# Patient Record
Sex: Male | Born: 1935 | Race: Black or African American | Hispanic: No | Smoking: Current every day smoker
Health system: Southern US, Community
[De-identification: ages and names within clinical notes are randomized; demographics above are authoritative.]

## PROBLEM LIST (undated history)

## (undated) DIAGNOSIS — C801 Malignant (primary) neoplasm, unspecified: Secondary | ICD-10-CM

## (undated) DIAGNOSIS — D649 Anemia, unspecified: Secondary | ICD-10-CM

## (undated) DIAGNOSIS — I10 Essential (primary) hypertension: Secondary | ICD-10-CM

## (undated) DIAGNOSIS — Z5189 Encounter for other specified aftercare: Secondary | ICD-10-CM

## (undated) DIAGNOSIS — N400 Enlarged prostate without lower urinary tract symptoms: Secondary | ICD-10-CM

## (undated) DIAGNOSIS — F419 Anxiety disorder, unspecified: Secondary | ICD-10-CM

## (undated) HISTORY — DX: Essential (primary) hypertension: I10

## (undated) HISTORY — DX: Benign prostatic hyperplasia without lower urinary tract symptoms: N40.0

## (undated) HISTORY — DX: Encounter for other specified aftercare: Z51.89

## (undated) HISTORY — DX: Malignant (primary) neoplasm, unspecified: C80.1

## (undated) HISTORY — DX: Anemia, unspecified: D64.9

## (undated) HISTORY — PX: OTHER SURGICAL HISTORY: SHX169

## (undated) HISTORY — DX: Anxiety disorder, unspecified: F41.9

---

## 1998-01-17 ENCOUNTER — Other Ambulatory Visit: Admission: RE | Admit: 1998-01-17 | Discharge: 1998-01-17 | Payer: Self-pay | Admitting: Hematology and Oncology

## 1998-02-14 ENCOUNTER — Other Ambulatory Visit: Admission: RE | Admit: 1998-02-14 | Discharge: 1998-02-14 | Payer: Self-pay | Admitting: Hematology and Oncology

## 1998-03-21 ENCOUNTER — Other Ambulatory Visit: Admission: RE | Admit: 1998-03-21 | Discharge: 1998-03-21 | Payer: Self-pay | Admitting: Hematology and Oncology

## 1998-04-22 ENCOUNTER — Ambulatory Visit (HOSPITAL_COMMUNITY): Admission: RE | Admit: 1998-04-22 | Discharge: 1998-04-22 | Payer: Self-pay | Admitting: Hematology and Oncology

## 1998-06-26 ENCOUNTER — Ambulatory Visit (HOSPITAL_COMMUNITY): Admission: RE | Admit: 1998-06-26 | Discharge: 1998-06-26 | Payer: Self-pay | Admitting: General Surgery

## 1998-06-26 ENCOUNTER — Encounter: Payer: Self-pay | Admitting: General Surgery

## 1998-10-05 ENCOUNTER — Emergency Department (HOSPITAL_COMMUNITY): Admission: EM | Admit: 1998-10-05 | Discharge: 1998-10-05 | Payer: Self-pay | Admitting: Emergency Medicine

## 1998-10-29 ENCOUNTER — Encounter: Payer: Self-pay | Admitting: General Surgery

## 1998-10-31 ENCOUNTER — Inpatient Hospital Stay (HOSPITAL_COMMUNITY): Admission: RE | Admit: 1998-10-31 | Discharge: 1998-11-11 | Payer: Self-pay | Admitting: General Surgery

## 1998-11-19 ENCOUNTER — Encounter: Admission: RE | Admit: 1998-11-19 | Discharge: 1998-11-19 | Payer: Self-pay | Admitting: Family Medicine

## 1999-10-28 ENCOUNTER — Ambulatory Visit (HOSPITAL_COMMUNITY): Admission: RE | Admit: 1999-10-28 | Discharge: 1999-10-28 | Payer: Self-pay | Admitting: Gastroenterology

## 2001-11-27 ENCOUNTER — Encounter: Payer: Self-pay | Admitting: Urology

## 2001-11-27 ENCOUNTER — Encounter: Admission: RE | Admit: 2001-11-27 | Discharge: 2001-11-27 | Payer: Self-pay | Admitting: Urology

## 2002-02-02 ENCOUNTER — Encounter: Payer: Self-pay | Admitting: Emergency Medicine

## 2002-02-02 ENCOUNTER — Emergency Department (HOSPITAL_COMMUNITY): Admission: EM | Admit: 2002-02-02 | Discharge: 2002-02-02 | Payer: Self-pay | Admitting: Emergency Medicine

## 2002-12-24 ENCOUNTER — Encounter: Admission: RE | Admit: 2002-12-24 | Discharge: 2002-12-24 | Payer: Self-pay | Admitting: Internal Medicine

## 2002-12-24 ENCOUNTER — Encounter: Payer: Self-pay | Admitting: Internal Medicine

## 2003-02-11 ENCOUNTER — Inpatient Hospital Stay (HOSPITAL_COMMUNITY): Admission: EM | Admit: 2003-02-11 | Discharge: 2003-02-11 | Payer: Self-pay | Admitting: Emergency Medicine

## 2003-05-02 ENCOUNTER — Emergency Department (HOSPITAL_COMMUNITY): Admission: EM | Admit: 2003-05-02 | Discharge: 2003-05-02 | Payer: Self-pay

## 2003-05-02 ENCOUNTER — Encounter: Payer: Self-pay | Admitting: Emergency Medicine

## 2003-05-08 ENCOUNTER — Emergency Department (HOSPITAL_COMMUNITY): Admission: EM | Admit: 2003-05-08 | Discharge: 2003-05-08 | Payer: Self-pay | Admitting: Emergency Medicine

## 2003-05-08 ENCOUNTER — Encounter: Payer: Self-pay | Admitting: Emergency Medicine

## 2004-01-28 ENCOUNTER — Emergency Department (HOSPITAL_COMMUNITY): Admission: EM | Admit: 2004-01-28 | Discharge: 2004-01-28 | Payer: Self-pay | Admitting: Emergency Medicine

## 2004-03-06 ENCOUNTER — Inpatient Hospital Stay (HOSPITAL_COMMUNITY): Admission: EM | Admit: 2004-03-06 | Discharge: 2004-03-09 | Payer: Self-pay | Admitting: Emergency Medicine

## 2004-12-07 ENCOUNTER — Emergency Department (HOSPITAL_COMMUNITY): Admission: EM | Admit: 2004-12-07 | Discharge: 2004-12-07 | Payer: Self-pay | Admitting: Emergency Medicine

## 2005-01-15 ENCOUNTER — Emergency Department (HOSPITAL_COMMUNITY): Admission: EM | Admit: 2005-01-15 | Discharge: 2005-01-15 | Payer: Self-pay | Admitting: Emergency Medicine

## 2005-09-04 ENCOUNTER — Emergency Department (HOSPITAL_COMMUNITY): Admission: EM | Admit: 2005-09-04 | Discharge: 2005-09-04 | Payer: Self-pay | Admitting: Emergency Medicine

## 2005-10-31 ENCOUNTER — Inpatient Hospital Stay (HOSPITAL_COMMUNITY): Admission: EM | Admit: 2005-10-31 | Discharge: 2005-11-11 | Payer: Self-pay | Admitting: Emergency Medicine

## 2005-11-04 ENCOUNTER — Encounter (INDEPENDENT_AMBULATORY_CARE_PROVIDER_SITE_OTHER): Payer: Self-pay | Admitting: *Deleted

## 2005-11-15 ENCOUNTER — Encounter
Admission: RE | Admit: 2005-11-15 | Discharge: 2005-11-15 | Payer: Self-pay | Admitting: Thoracic Surgery (Cardiothoracic Vascular Surgery)

## 2006-01-28 ENCOUNTER — Emergency Department (HOSPITAL_COMMUNITY): Admission: EM | Admit: 2006-01-28 | Discharge: 2006-01-28 | Payer: Self-pay | Admitting: Emergency Medicine

## 2006-03-23 IMAGING — CT CT HEAD W/O CM
1 of 2 series · 13 of 30 positions shown, 17 images · non-contrast
Comparison: none

CLINICAL DATA: Right frontal laceration following a fall. 
 HEAD CT WITHOUT CONTRAST, 01/28/04
 Diffusely enlarged ventricles and subarachnoid spaces.  Small, old lacunar infarct at the lateral margin of the right thalamus.  Mild white matter low density in both cerebral hemispheres.  Right periorbital soft tissue hematoma.  No skull fracture, intracranial hemorrhage, mass effect or paranasal sinuses air-fluid levels.  Bilateral internal carotid artery atheromatous calcifications.  Bilateral frontal, ethmoid and sphenoid sinus mucosal thickening.  The maxillary sinuses are not included except for the most superior aspect of the maxillary sinus on the left.  
 IMPRESSION
 Moderate to marked diffuse cerebral and cerebellar atrophy. 
 Old right thalamic lacunar infarct.  
 Mild small vessel white matter ischemic changes in both cerebral hemispheres.  
 No skull fracture or intracranial hemorrhage.  
 Chronic bilateral frontal, ethmoid and sphenoid sinusitis.

[Series 2: brain · axial · 0.47mm/px · z∈[+168,+295]mm · 13 of 28 slices shown, 17 images]
[im 2/28  brain]
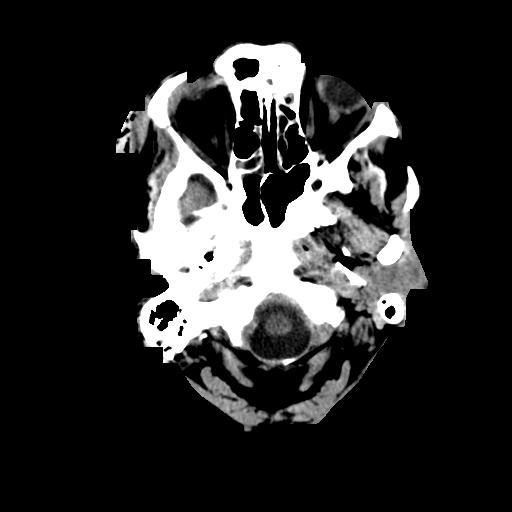
[im 2/28  bone]
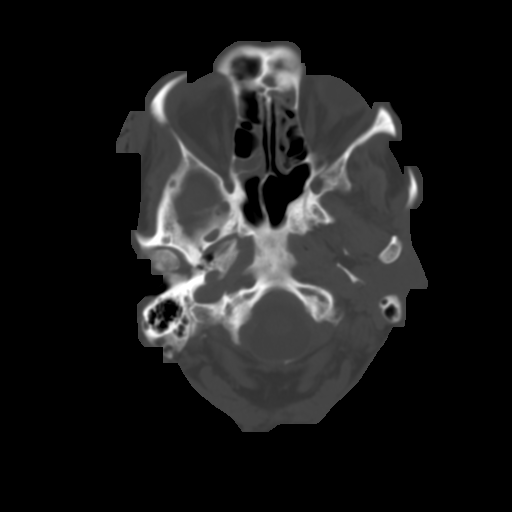
[im 4/28  brain]
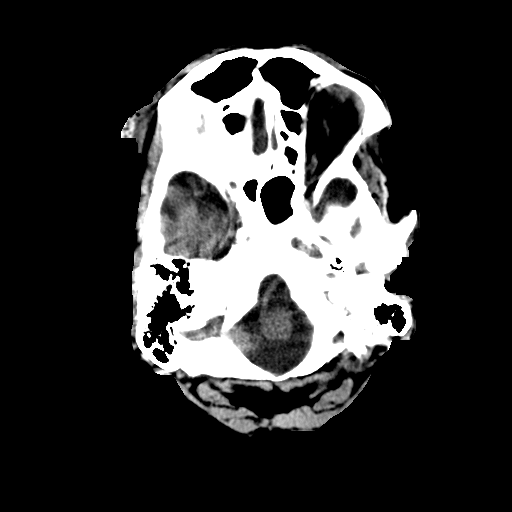
[im 6/28  brain]
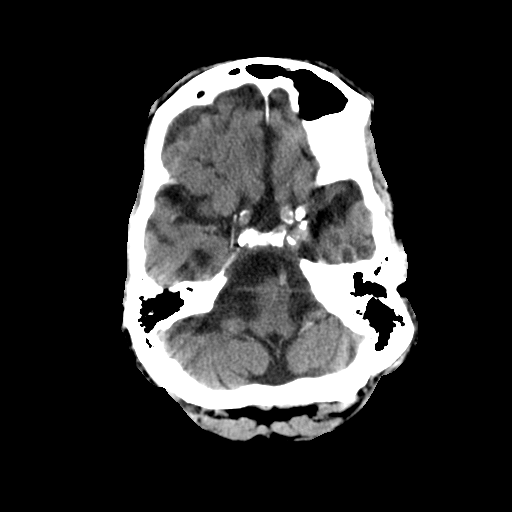
[im 8/28  brain]
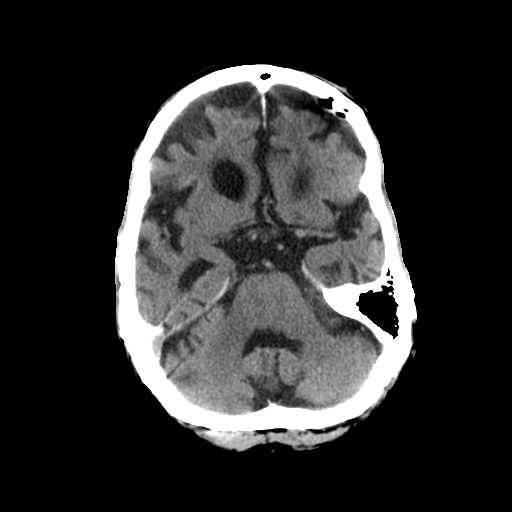
[im 10/28  brain]
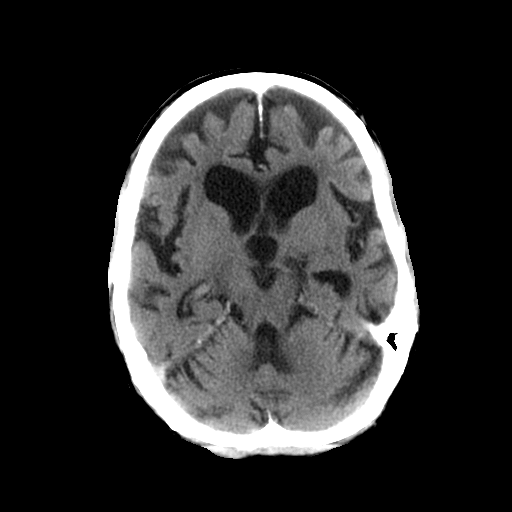
[im 10/28  bone]
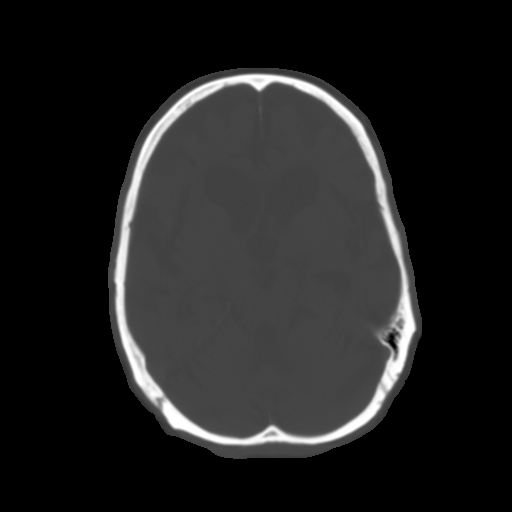
[im 12/28  brain]
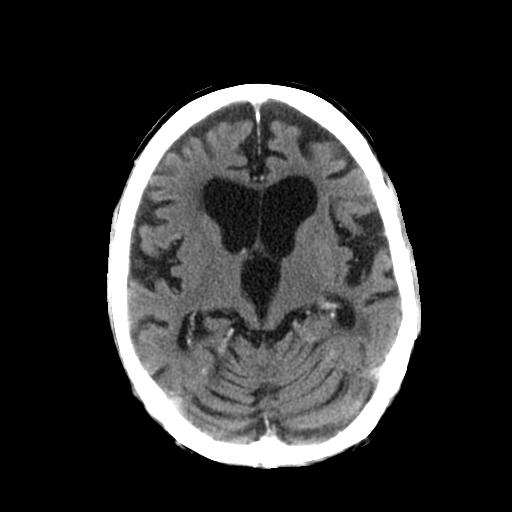
[im 14/28  brain]
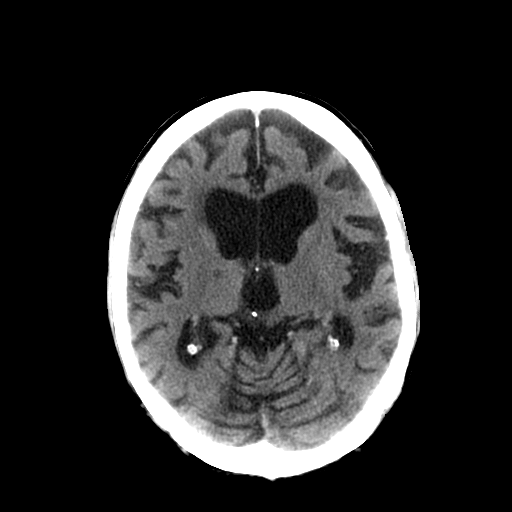
[im 16/28  brain]
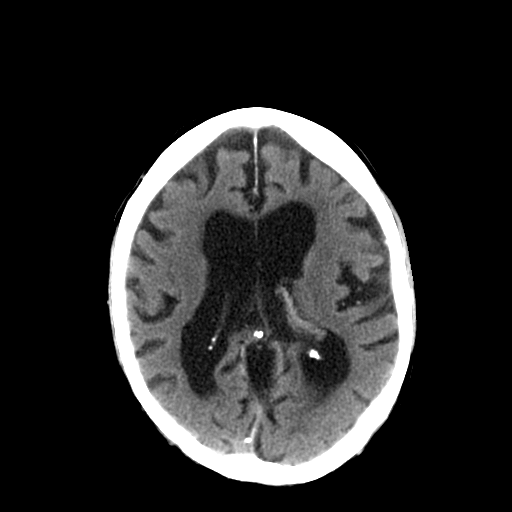
[im 18/28  brain]
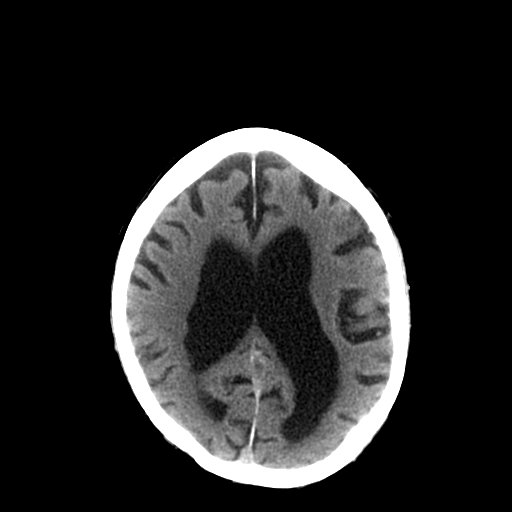
[im 18/28  bone]
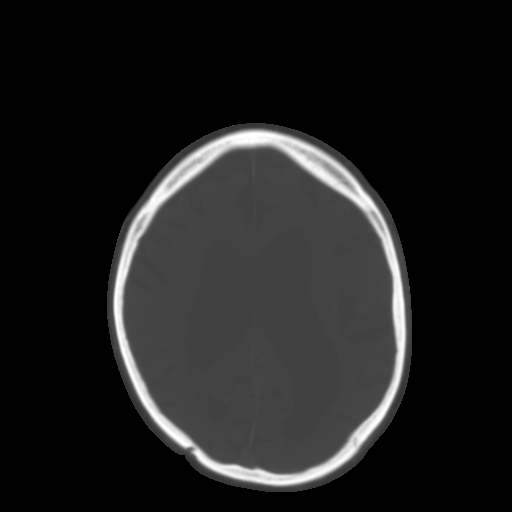
[im 20/28  brain]
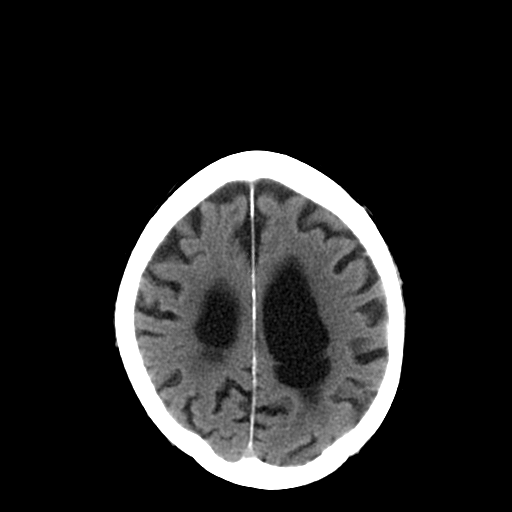
[im 22/28  brain]
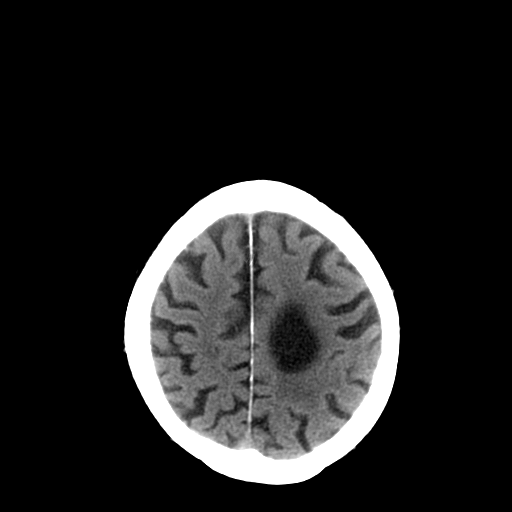
[im 24/28  brain]
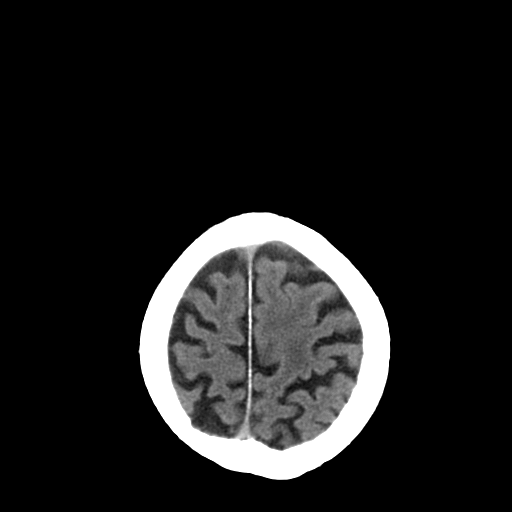
[im 26/28  brain]
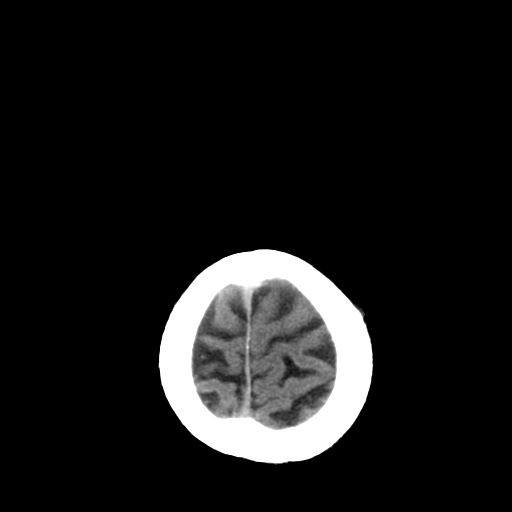
[im 26/28  bone]
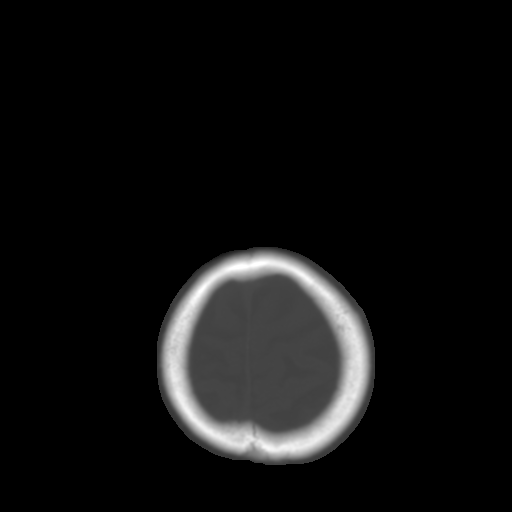

[13 of 30 positions shown; findings below may reference images not displayed]

## 2006-03-24 ENCOUNTER — Emergency Department (HOSPITAL_COMMUNITY): Admission: EM | Admit: 2006-03-24 | Discharge: 2006-03-25 | Payer: Self-pay | Admitting: Emergency Medicine

## 2006-05-05 ENCOUNTER — Emergency Department (HOSPITAL_COMMUNITY): Admission: EM | Admit: 2006-05-05 | Discharge: 2006-05-05 | Payer: Self-pay | Admitting: Emergency Medicine

## 2007-12-25 ENCOUNTER — Emergency Department (HOSPITAL_COMMUNITY): Admission: EM | Admit: 2007-12-25 | Discharge: 2007-12-25 | Payer: Self-pay | Admitting: Emergency Medicine

## 2008-03-11 ENCOUNTER — Ambulatory Visit: Payer: Self-pay | Admitting: Cardiology

## 2008-03-11 ENCOUNTER — Inpatient Hospital Stay (HOSPITAL_COMMUNITY): Admission: EM | Admit: 2008-03-11 | Discharge: 2008-03-15 | Payer: Self-pay | Admitting: Emergency Medicine

## 2008-03-12 ENCOUNTER — Encounter: Payer: Self-pay | Admitting: Cardiology

## 2009-01-02 ENCOUNTER — Emergency Department (HOSPITAL_COMMUNITY): Admission: EM | Admit: 2009-01-02 | Discharge: 2009-01-02 | Payer: Self-pay | Admitting: Emergency Medicine

## 2009-01-22 ENCOUNTER — Emergency Department (HOSPITAL_COMMUNITY): Admission: EM | Admit: 2009-01-22 | Discharge: 2009-01-22 | Payer: Self-pay | Admitting: Emergency Medicine

## 2009-02-20 ENCOUNTER — Observation Stay (HOSPITAL_COMMUNITY): Admission: EM | Admit: 2009-02-20 | Discharge: 2009-02-21 | Payer: Self-pay | Admitting: Emergency Medicine

## 2009-04-09 ENCOUNTER — Emergency Department (HOSPITAL_COMMUNITY): Admission: EM | Admit: 2009-04-09 | Discharge: 2009-04-09 | Payer: Self-pay | Admitting: Emergency Medicine

## 2009-07-16 ENCOUNTER — Emergency Department (HOSPITAL_COMMUNITY): Admission: EM | Admit: 2009-07-16 | Discharge: 2009-07-16 | Payer: Self-pay | Admitting: Emergency Medicine

## 2009-09-29 ENCOUNTER — Observation Stay (HOSPITAL_COMMUNITY): Admission: EM | Admit: 2009-09-29 | Discharge: 2009-10-01 | Payer: Self-pay | Admitting: Emergency Medicine

## 2009-09-29 ENCOUNTER — Ambulatory Visit: Payer: Self-pay | Admitting: Cardiology

## 2009-09-30 ENCOUNTER — Ambulatory Visit: Payer: Self-pay | Admitting: Surgery

## 2009-09-30 ENCOUNTER — Encounter (INDEPENDENT_AMBULATORY_CARE_PROVIDER_SITE_OTHER): Payer: Self-pay | Admitting: Internal Medicine

## 2009-11-11 ENCOUNTER — Emergency Department (HOSPITAL_COMMUNITY): Admission: EM | Admit: 2009-11-11 | Discharge: 2009-11-11 | Payer: Self-pay | Admitting: Emergency Medicine

## 2010-01-23 ENCOUNTER — Observation Stay (HOSPITAL_COMMUNITY): Admission: EM | Admit: 2010-01-23 | Discharge: 2010-02-12 | Payer: Self-pay | Admitting: Emergency Medicine

## 2010-01-26 ENCOUNTER — Encounter (INDEPENDENT_AMBULATORY_CARE_PROVIDER_SITE_OTHER): Payer: Self-pay | Admitting: Internal Medicine

## 2010-01-26 ENCOUNTER — Ambulatory Visit: Payer: Self-pay | Admitting: Vascular Surgery

## 2010-02-26 ENCOUNTER — Emergency Department (HOSPITAL_COMMUNITY): Admission: EM | Admit: 2010-02-26 | Discharge: 2010-02-26 | Payer: Self-pay | Admitting: Diagnostic Radiology

## 2010-09-13 ENCOUNTER — Encounter: Payer: Self-pay | Admitting: Thoracic Surgery

## 2010-11-09 LAB — DIFFERENTIAL
Basophils Absolute: 0.1 10*3/uL (ref 0.0–0.1)
Basophils Relative: 2 % — ABNORMAL HIGH (ref 0–1)
Eosinophils Absolute: 0.2 10*3/uL (ref 0.0–0.7)
Eosinophils Relative: 3 % (ref 0–5)
Lymphocytes Relative: 40 % (ref 12–46)
Lymphs Abs: 2.1 K/uL (ref 0.7–4.0)
Monocytes Absolute: 0.3 10*3/uL (ref 0.1–1.0)
Monocytes Relative: 6 % (ref 3–12)
Neutro Abs: 2.6 K/uL (ref 1.7–7.7)
Neutrophils Relative %: 49 % (ref 43–77)

## 2010-11-09 LAB — BASIC METABOLIC PANEL
CO2: 22 mEq/L (ref 19–32)
CO2: 22 mEq/L (ref 19–32)
CO2: 23 mEq/L (ref 19–32)
Calcium: 8.6 mg/dL (ref 8.4–10.5)
Calcium: 9 mg/dL (ref 8.4–10.5)
Calcium: 9 mg/dL (ref 8.4–10.5)
Calcium: 9.1 mg/dL (ref 8.4–10.5)
Chloride: 109 mEq/L (ref 96–112)
Creatinine, Ser: 0.86 mg/dL (ref 0.4–1.5)
Creatinine, Ser: 0.95 mg/dL (ref 0.4–1.5)
Creatinine, Ser: 0.98 mg/dL (ref 0.4–1.5)
GFR calc Af Amer: >60 mL/min (ref >60)
GFR calc Af Amer: >60 mL/min (ref >60)
GFR calc Af Amer: >60 mL/min (ref >60)
GFR calc non Af Amer: >60 mL/min (ref >60)
GFR calc non Af Amer: >60 mL/min (ref >60)
Glucose, Bld: 87 mg/dL (ref 70–99)
Glucose, Bld: 94 mg/dL (ref 70–99)
Potassium: 3.7 mEq/L (ref 3.5–5.1)
Potassium: 4.2 mEq/L (ref 3.5–5.1)
Sodium: 138 mEq/L (ref 135–145)
Sodium: 138 mEq/L (ref 135–145)
Sodium: 139 mEq/L (ref 135–145)
Sodium: 140 mEq/L (ref 135–145)

## 2010-11-09 LAB — CBC
HCT: 31.9 % — ABNORMAL LOW (ref 39.0–52.0)
HCT: 34.9 % — ABNORMAL LOW (ref 39.0–52.0)
Hemoglobin: 11 g/dL — ABNORMAL LOW (ref 13.0–17.0)
Hemoglobin: 11.3 g/dL — ABNORMAL LOW (ref 13.0–17.0)
Hemoglobin: 11.9 g/dL — ABNORMAL LOW (ref 13.0–17.0)
Hemoglobin: 12.3 g/dL — ABNORMAL LOW (ref 13.0–17.0)
MCHC: 34.1 g/dL (ref 30.0–36.0)
MCHC: 34.5 g/dL (ref 30.0–36.0)
MCHC: 34.7 g/dL (ref 30.0–36.0)
MCV: 87.3 fL (ref 78.0–100.0)
MCV: 87.6 fL (ref 78.0–100.0)
Platelets: ADEQUATE K/uL (ref 150–400)
RBC: 3.65 MIL/uL — ABNORMAL LOW (ref 4.22–5.81)
RBC: 3.76 MIL/uL — ABNORMAL LOW (ref 4.22–5.81)
RBC: 3.99 MIL/uL — ABNORMAL LOW (ref 4.22–5.81)
RBC: 4.19 MIL/uL — ABNORMAL LOW (ref 4.22–5.81)
RDW: 15.9 % — ABNORMAL HIGH (ref 11.5–15.5)
RDW: 16.2 % — ABNORMAL HIGH (ref 11.5–15.5)
RDW: 16.6 % — ABNORMAL HIGH (ref 11.5–15.5)
WBC: 4.5 10*3/uL (ref 4.0–10.5)
WBC: 5.3 K/uL (ref 4.0–10.5)

## 2010-11-09 LAB — BASIC METABOLIC PANEL WITH GFR
BUN: 13 mg/dL (ref 6–23)
CO2: 25 meq/L (ref 19–32)
Chloride: 105 meq/L (ref 96–112)
GFR calc Af Amer: >60 mL/min (ref >60)
GFR calc non Af Amer: >60 mL/min (ref >60)
Glucose, Bld: 94 mg/dL (ref 70–99)

## 2010-11-09 LAB — LIPID PANEL
Cholesterol: 88 mg/dL (ref 0–200)
LDL Cholesterol: 52 mg/dL (ref 0–99)
Total CHOL/HDL Ratio: 2.8 RATIO

## 2010-11-09 LAB — FOLATE RBC: RBC Folate: 610 ng/mL — ABNORMAL HIGH (ref 180–600)

## 2010-11-09 LAB — TSH
TSH: 0.705 u[IU]/mL (ref 0.350–4.500)
TSH: 1.618 u[IU]/mL (ref 0.350–4.500)

## 2010-11-09 LAB — VITAMIN B12: Vitamin B-12: 337 pg/mL (ref 211–911)

## 2010-11-13 LAB — CBC
Hemoglobin: 12.2 g/dL — ABNORMAL LOW (ref 13.0–17.0)
MCHC: 33.6 g/dL (ref 30.0–36.0)
MCHC: 33.9 g/dL (ref 30.0–36.0)
MCV: 89.8 fL (ref 78.0–100.0)
Platelets: 186 10*3/uL (ref 150–400)
RBC: 4 MIL/uL — ABNORMAL LOW (ref 4.22–5.81)
RBC: 4.02 MIL/uL — ABNORMAL LOW (ref 4.22–5.81)
RDW: 16.1 % — ABNORMAL HIGH (ref 11.5–15.5)
WBC: 4.6 10*3/uL (ref 4.0–10.5)

## 2010-11-13 LAB — BASIC METABOLIC PANEL
CO2: 27 mEq/L (ref 19–32)
Chloride: 102 mEq/L (ref 96–112)
Creatinine, Ser: 1.08 mg/dL (ref 0.4–1.5)
GFR calc Af Amer: >60 mL/min (ref >60)
Glucose, Bld: 109 mg/dL — ABNORMAL HIGH (ref 70–99)

## 2010-11-13 LAB — DIFFERENTIAL
Basophils Absolute: 0 10*3/uL (ref 0.0–0.1)
Basophils Relative: 1 % (ref 0–1)
Eosinophils Absolute: 0 10*3/uL (ref 0.0–0.7)
Monocytes Absolute: 0.3 10*3/uL (ref 0.1–1.0)
Monocytes Relative: 6 % (ref 3–12)
Neutrophils Relative %: 61 % (ref 43–77)

## 2010-11-13 LAB — CORTISOL-AM, BLOOD: Cortisol - AM: 7.9 ug/dL (ref 4.3–22.4)

## 2010-11-13 LAB — HEMOGLOBIN A1C: Mean Plasma Glucose: 128 mg/dL

## 2010-11-13 LAB — CK TOTAL AND CKMB (NOT AT ARMC)
CK, MB: 3 ng/mL (ref 0.3–4.0)
CK, MB: 3 ng/mL (ref 0.3–4.0)
Relative Index: 2.6 — ABNORMAL HIGH (ref 0.0–2.5)
Total CK: 109 U/L (ref 7–232)
Total CK: 126 U/L (ref 7–232)

## 2010-11-13 LAB — RETICULOCYTES
RBC.: 4.11 MIL/uL — ABNORMAL LOW (ref 4.22–5.81)
Retic Count, Absolute: 61.7 10*3/uL (ref 19.0–186.0)
Retic Ct Pct: 1.5 % (ref 0.4–3.1)

## 2010-11-13 LAB — FOLATE: Folate: >20 ng/mL

## 2010-11-13 LAB — IRON AND TIBC
Iron: 140 ug/dL — ABNORMAL HIGH (ref 42–135)
TIBC: 272 ug/dL (ref 215–435)

## 2010-11-13 LAB — TSH: TSH: 0.941 u[IU]/mL (ref 0.350–4.500)

## 2010-11-13 LAB — TROPONIN I: Troponin I: 0.02 ng/mL (ref 0.00–0.06)

## 2010-11-16 LAB — CBC
HCT: 34.3 % — ABNORMAL LOW (ref 39.0–52.0)
Hemoglobin: 11.8 g/dL — ABNORMAL LOW (ref 13.0–17.0)
MCV: 88 fL (ref 78.0–100.0)
Platelets: 170 10*3/uL (ref 150–400)
RBC: 3.9 MIL/uL — ABNORMAL LOW (ref 4.22–5.81)
WBC: 4.6 10*3/uL (ref 4.0–10.5)

## 2010-11-16 LAB — POCT CARDIAC MARKERS
Myoglobin, poc: 134 ng/mL (ref 12–200)
Troponin i, poc: <0.05 ng/mL (ref 0.00–0.09)
Troponin i, poc: <0.05 ng/mL (ref 0.00–0.09)

## 2010-11-16 LAB — DIFFERENTIAL
Eosinophils Absolute: 0.2 10*3/uL (ref 0.0–0.7)
Eosinophils Relative: 5 % (ref 0–5)
Lymphocytes Relative: 43 % (ref 12–46)
Lymphs Abs: 2 10*3/uL (ref 0.7–4.0)
Monocytes Absolute: 0.3 10*3/uL (ref 0.1–1.0)
Monocytes Relative: 6 % (ref 3–12)

## 2010-11-16 LAB — URINALYSIS, ROUTINE W REFLEX MICROSCOPIC
Glucose, UA: NEGATIVE mg/dL
Hgb urine dipstick: NEGATIVE
Protein, ur: NEGATIVE mg/dL
Specific Gravity, Urine: 1.005 (ref 1.005–1.030)

## 2010-11-16 LAB — BASIC METABOLIC PANEL
BUN: 9 mg/dL (ref 6–23)
Chloride: 108 mEq/L (ref 96–112)
GFR calc non Af Amer: >60 mL/min (ref >60)
Potassium: 3.5 mEq/L (ref 3.5–5.1)
Sodium: 139 mEq/L (ref 135–145)

## 2010-11-25 LAB — BASIC METABOLIC PANEL
CO2: 22 mEq/L (ref 19–32)
Calcium: 9.2 mg/dL (ref 8.4–10.5)
GFR calc Af Amer: >60 mL/min (ref >60)
GFR calc non Af Amer: 60 mL/min — ABNORMAL LOW (ref >60)
Glucose, Bld: 89 mg/dL (ref 70–99)
Potassium: 3.8 mEq/L (ref 3.5–5.1)
Sodium: 136 mEq/L (ref 135–145)

## 2010-11-25 LAB — POCT CARDIAC MARKERS

## 2010-11-25 LAB — CBC
HCT: 38.9 % — ABNORMAL LOW (ref 39.0–52.0)
Hemoglobin: 13.1 g/dL (ref 13.0–17.0)
RBC: 4.32 MIL/uL (ref 4.22–5.81)
RDW: 16.5 % — ABNORMAL HIGH (ref 11.5–15.5)

## 2010-11-25 LAB — URINALYSIS, ROUTINE W REFLEX MICROSCOPIC
Glucose, UA: NEGATIVE mg/dL
Ketones, ur: 15 mg/dL — AB
Specific Gravity, Urine: 1.019 (ref 1.005–1.030)
pH: 5 (ref 5.0–8.0)

## 2010-11-25 LAB — DIFFERENTIAL
Basophils Absolute: 0 10*3/uL (ref 0.0–0.1)
Basophils Relative: 0 % (ref 0–1)
Eosinophils Relative: 0 % (ref 0–5)
Lymphocytes Relative: 8 % — ABNORMAL LOW (ref 12–46)
Monocytes Absolute: 0.1 10*3/uL (ref 0.1–1.0)

## 2010-11-25 LAB — RAPID URINE DRUG SCREEN, HOSP PERFORMED
Barbiturates: NOT DETECTED
Cocaine: NOT DETECTED
Opiates: NOT DETECTED
Tetrahydrocannabinol: NOT DETECTED

## 2010-11-25 LAB — URINE MICROSCOPIC-ADD ON

## 2010-11-25 LAB — URINE CULTURE: Colony Count: >=100000

## 2010-11-25 LAB — GLUCOSE, CAPILLARY: Glucose-Capillary: 111 mg/dL — ABNORMAL HIGH (ref 70–99)

## 2010-11-28 LAB — BASIC METABOLIC PANEL
CO2: 24 mEq/L (ref 19–32)
Calcium: 8.8 mg/dL (ref 8.4–10.5)
Creatinine, Ser: 0.99 mg/dL (ref 0.4–1.5)
GFR calc Af Amer: >60 mL/min (ref >60)
GFR calc non Af Amer: >60 mL/min (ref >60)

## 2010-11-28 LAB — URINALYSIS, ROUTINE W REFLEX MICROSCOPIC
Leukocytes, UA: NEGATIVE
Protein, ur: 30 mg/dL — AB
Specific Gravity, Urine: 1.024 (ref 1.005–1.030)
Urobilinogen, UA: 1 mg/dL (ref 0.0–1.0)

## 2010-11-28 LAB — DIFFERENTIAL
Basophils Absolute: 0 10*3/uL (ref 0.0–0.1)
Basophils Relative: 0 % (ref 0–1)
Lymphocytes Relative: 30 % (ref 12–46)
Monocytes Relative: 7 % (ref 3–12)
Neutro Abs: 4.4 10*3/uL (ref 1.7–7.7)
Neutrophils Relative %: 62 % (ref 43–77)

## 2010-11-28 LAB — CBC
MCHC: 33.2 g/dL (ref 30.0–36.0)
RBC: 3.84 MIL/uL — ABNORMAL LOW (ref 4.22–5.81)
RDW: 16.7 % — ABNORMAL HIGH (ref 11.5–15.5)

## 2010-11-28 LAB — URINE MICROSCOPIC-ADD ON

## 2010-11-30 LAB — POCT CARDIAC MARKERS
Myoglobin, poc: 49.7 ng/mL (ref 12–200)
Myoglobin, poc: 61.7 ng/mL (ref 12–200)

## 2010-11-30 LAB — CBC
HCT: 36.3 % — ABNORMAL LOW (ref 39.0–52.0)
Hemoglobin: 11.6 g/dL — ABNORMAL LOW (ref 13.0–17.0)
Hemoglobin: 12.1 g/dL — ABNORMAL LOW (ref 13.0–17.0)
MCHC: 33.2 g/dL (ref 30.0–36.0)
MCV: 87.3 fL (ref 78.0–100.0)
Platelets: 185 10*3/uL (ref 150–400)
RBC: 3.93 MIL/uL — ABNORMAL LOW (ref 4.22–5.81)
RBC: 4.22 MIL/uL (ref 4.22–5.81)
RDW: 16.8 % — ABNORMAL HIGH (ref 11.5–15.5)
WBC: 6.7 10*3/uL (ref 4.0–10.5)
WBC: 9.9 10*3/uL (ref 4.0–10.5)

## 2010-11-30 LAB — URINE MICROSCOPIC-ADD ON

## 2010-11-30 LAB — COMPREHENSIVE METABOLIC PANEL
ALT: 8 U/L (ref 0–53)
AST: 17 U/L (ref 0–37)
Albumin: 3.4 g/dL — ABNORMAL LOW (ref 3.5–5.2)
Alkaline Phosphatase: 71 U/L (ref 39–117)
Chloride: 106 mEq/L (ref 96–112)
GFR calc Af Amer: >60 mL/min (ref >60)
Potassium: 3.9 mEq/L (ref 3.5–5.1)
Total Bilirubin: 0.7 mg/dL (ref 0.3–1.2)

## 2010-11-30 LAB — CK TOTAL AND CKMB (NOT AT ARMC)
CK, MB: 1.7 ng/mL (ref 0.3–4.0)
CK, MB: 1.8 ng/mL (ref 0.3–4.0)
Total CK: 73 U/L (ref 7–232)
Total CK: 75 U/L (ref 7–232)

## 2010-11-30 LAB — DIFFERENTIAL
Basophils Absolute: 0 10*3/uL (ref 0.0–0.1)
Basophils Absolute: 0 10*3/uL (ref 0.0–0.1)
Basophils Relative: 0 % (ref 0–1)
Basophils Relative: 1 % (ref 0–1)
Eosinophils Absolute: 0.1 10*3/uL (ref 0.0–0.7)
Eosinophils Absolute: 0.1 10*3/uL (ref 0.0–0.7)
Eosinophils Relative: 1 % (ref 0–5)
Eosinophils Relative: 1 % (ref 0–5)
Monocytes Absolute: 0.4 10*3/uL (ref 0.1–1.0)
Monocytes Absolute: 0.4 10*3/uL (ref 0.1–1.0)

## 2010-11-30 LAB — POCT I-STAT, CHEM 8
BUN: 14 mg/dL (ref 6–23)
Creatinine, Ser: 0.9 mg/dL (ref 0.4–1.5)
Sodium: 142 mEq/L (ref 135–145)
TCO2: 22 mmol/L (ref 0–100)

## 2010-11-30 LAB — URINALYSIS, ROUTINE W REFLEX MICROSCOPIC
Glucose, UA: NEGATIVE mg/dL
Hgb urine dipstick: NEGATIVE
pH: 5.5 (ref 5.0–8.0)

## 2010-11-30 LAB — BASIC METABOLIC PANEL
CO2: 24 mEq/L (ref 19–32)
Calcium: 8.8 mg/dL (ref 8.4–10.5)
GFR calc Af Amer: >60 mL/min (ref >60)
GFR calc non Af Amer: >60 mL/min (ref >60)
Sodium: 137 mEq/L (ref 135–145)

## 2010-11-30 LAB — GLUCOSE, CAPILLARY: Glucose-Capillary: 88 mg/dL (ref 70–99)

## 2010-12-01 LAB — CK: Total CK: 62 U/L (ref 7–232)

## 2010-12-01 LAB — CBC
Hemoglobin: 11.9 g/dL — ABNORMAL LOW (ref 13.0–17.0)
MCHC: 33.7 g/dL (ref 30.0–36.0)
RDW: 17 % — ABNORMAL HIGH (ref 11.5–15.5)

## 2010-12-01 LAB — DIFFERENTIAL
Basophils Relative: 1 % (ref 0–1)
Eosinophils Absolute: 0.2 10*3/uL (ref 0.0–0.7)
Monocytes Relative: 7 % (ref 3–12)
Neutrophils Relative %: 40 % — ABNORMAL LOW (ref 43–77)

## 2010-12-01 LAB — POCT I-STAT, CHEM 8
BUN: 13 mg/dL (ref 6–23)
Calcium, Ion: 1.11 mmol/L — ABNORMAL LOW (ref 1.12–1.32)
Chloride: 108 mEq/L (ref 96–112)
Glucose, Bld: 96 mg/dL (ref 70–99)
HCT: 36 % — ABNORMAL LOW (ref 39.0–52.0)

## 2010-12-01 LAB — POCT CARDIAC MARKERS
CKMB, poc: <1 ng/mL — ABNORMAL LOW (ref 1.0–8.0)
Myoglobin, poc: 41.3 ng/mL (ref 12–200)
Troponin i, poc: <0.05 ng/mL (ref 0.00–0.09)
Troponin i, poc: <0.05 ng/mL (ref 0.00–0.09)

## 2010-12-01 LAB — PROTIME-INR: INR: 1.2 (ref 0.00–1.49)

## 2011-01-05 NOTE — Consult Note (Signed)
NAME:  Bobby [name], Bobby [name] NO.:  [id number]   MEDICAL RECORD NO.:  [id number]          PATIENT TYPE:  INP   LOCATION:  3714                         FACILITY:  [facility]   PHYSICIAN:  Madolyn Frieze. Jens Som, MD, FACCDATE OF BIRTH:  March 12, 1928   DATE OF CONSULTATION:  DATE OF DISCHARGE:                                 CONSULTATION   The patient is a 77 year old male with past medical history of EtOH  abuse, dementia, COPD who we are asked to evaluate for chest pain.  The  patient has no prior cardiac history.  Apparently he resided in the  nursing home until approximately 6 months ago.  He then moved in with  his sister.  Since then his daughter states that he has resumed  consuming significant amounts of alcohol and she states he had been an  alcoholic for the past 40-50 years.  He is also smoking.  Today, he  developed left-sided chest pain initially towards the axilla and then in  the left breast area.  The pain did not radiate, although he did state  that there was some pain in his neck as well.  Both pains increased with  certain movements.  There was associated shortness of breath and  diaphoresis.  There was no nausea or vomiting.  The pain was not  pleuritic.  He was brought to the emergency room and his  electrocardiogram revealed normal sinus rhythm with T-wave inversion in  V1 through V3.  His initial CK-MB was mildly increased, but his troponin  was normal.  We are asked to further evaluate.  Note, the patient is  alert and oriented x2 (to person and place, but not that time).  He does  have significant dyspnea on exertion, which has been a chronic issue,  but there is no orthopnea, PND or pedal edema.   MEDICATION:  His medications at home include,  1. Sertraline 9 mg p.o. daily.  2. Aricept 5 mg p.o. daily.  3. Celebrex 200  p.o. mg daily.  4. Foley 1 mg p.o. daily.  5. Risperidone.  6. Enablex.   PAST MEDICAL HISTORY:  His past medical history is significant  for  Dementia.  There is no diabetes mellitus or hyperlipidemia, but  apparently there is a history of hypertension.  He has a history of COPD  with spontaneous pneumothorax in the past.  He also has DJD.  He has had  previous stapling of apical blebs.   ALLERGIES:  There is no known drug allergies.   SOCIAL HISTORY:  He has a long history of alcohol abuse and tobacco  abuse.   FAMILY HISTORY:  His family history is unknown.   REVIEW OF SYSTEMS:  His review of systems, he denies any headaches or  fevers, chills.  He has had a cough but there is no hemoptysis.  There  is no melena or hematochezia.  There is no dysuria, hematuria.  There is  no rash or seizure activity.  There is no orthopnea, PND or pedal edema.  Note, he does have some difficulties with incontinence.  The remaining  systems are negative other than dementia.   PHYSICAL EXAMINATION:  VITALS SIGNS:  Physical examination today shows a  blood pressure of 133/76 and his pulse is 102.  His temperature is 99.3.  GENERAL:  A well-developed and chronically ill appearing.  He does not  appear to be in acute distress.  SKIN:  Warm and dry.  There is no peripheral clubbing, but there is  discoloration from tobacco abuse.  BACK:  Normal.  HEENT:  Normal with normal eyelids.  NECK:  Supple with a normal upstroke bilaterally.  I cannot appreciate  bruits.  There is no jugular distention.  No thyromegaly.  CHEST:  Shows diminished breath sounds throughout with minimal crackles  at the bases.  There is no wheeze noted.  CARDIOVASCULAR:  Regular rate  and rhythm with a normal S1 and S2.  There are no murmurs, rubs or  gallops noted.  Note, his heart sounds are distant.  ABDOMEN:  Nondistended.  Positive bowel sounds. No hepatosplenomegaly.  No mass appreciated. There is no abdominal bruit.  He has 2+ femoral  pulses bilaterally.  No bruits.  EXTREMITIES:  No edema and I could palpate no cords.  His distal pulses  are diminished.   NEUROLOGIC:  Grossly intact.   His chest x-ray shows mild left lower lobe atelectasis and COPD, but it  is otherwise unremarkable.  His CBC shows a white blood cell count of  6.8 with a hemoglobin 15.8, hematocrit of 46.3.  His platelet count is  148.  His point-of-care markers show a CK-MB that is mildly elevated at  11.6, but his troponin is normal.  BNP is 148.  His sodium is 140 with  potassium of 4.4.  His BUN and creatinine are 8 and 0.76 respectively.   DIAGNOSES:  1. Chest pain - Mr. Lamagna' symptoms are somewhat difficult to evaluate      as he is a poor historian.  His symptoms sound somewhat atypical,      but his electrocardiogram does show T-wave inversion in V1 through      V3 and his CK-MB is mildly elevated despite a normal troponin I.      This is a very difficult situation given his history of dementia      and alcohol abuse.  I am concerned about the possibility of      withdrawal during this admission and I am hesitant to schedule him      any procedures until we are sure that that is not the case.  I have      discussed the patient with his primary care physician who is Dr.      Concepcion Elk.  We will plan to admit to telemetry and Dr. Concepcion Elk will      be the primary, and we will be consultants.  We will cycle enzymes      and if they are negative, we will most likely proceed with a      Myoview.  I will also schedule for him to have an echocardiogram to      rule out wall motion abnormalities particularly in light of his      septal electrocardiographic changes.  We will treat with aspirin,      low-dose Lopressor as well as heparin.  If he rues in, then we will      need to follow him for any signs of withdrawal and potentially      discuss the aggressiveness of his  care further with his family.  He      will also most likely need nursing home placement.  2. Hypertension - We will continue with his present blood pressure      medications and track this.  3. Alcohol  abuse - We will need to follow him closely for withdrawal.  4. Chronic obstructive pulmonary disease - management per his primary      care physician.  5. Dementia.      Madolyn Frieze Jens Som, MD, [facility]  Electronically Signed     BSC/MEDQ  D:  03/11/2008  T:  03/12/2008  Job:  [id number]

## 2011-01-05 NOTE — Discharge Summary (Signed)
NAME:  Bobby [name], HAGGART NO.:  [id number]   MEDICAL RECORD NO.:  [id number]          PATIENT TYPE:  INP   LOCATION:  3714                         FACILITY:  [facility]   PHYSICIAN:  Fleet Contras, M.D.    DATE OF BIRTH:  03-06-1928   DATE OF ADMISSION:  02/20/2009  DATE OF DISCHARGE:  02/21/2009                               DISCHARGE SUMMARY   HISTORY OF PRESENTING ILLNESS:  Mr. Bulthuis is a 77 year old African  American gentleman with past medical history significant for chronic  obstructive pulmonary disease related to tobacco abuse, systemic  hypertension, history of chronic alcohol abuse, dementia, depression and  coronary artery disease.  He was admitted via the emergency room at  [facility] after he was found down at the nursing home where he  lives.  He was on the floor, apparently poorly responsive initially.  Then he awoke and complained of pain at multiple sites including his  chest, lower back, arms, neck and legs.  There was no reported weakness,  seizures or syncope.  At the emergency room he was awake, alert and  oriented.  His vital signs were stable.  He was chronically ill-looking,  not pale, not icteric, not cyanosed.  He was tender to the right  anterolateral chest wall as well as the lumbar region.  There were no  focal neurological deficits.   His laboratory data showed a sodium of 142, potassium 4.6, chloride 109,  BUN was 14, creatinine was 0.9 and glucose of 112.  His white count was  6.2, his hemoglobin was 12.1, his hematocrit 36.3, platelet count of  169.  Chest x-ray showed changes of hyperinflation with no obvious rib  fractures, no acute infiltrates.  CT scan of the head showed no acute  infarct or hemorrhage.  X-ray of the lumbar spine showed degenerative  changes with no fracture or dislocation.  X-ray of the cervical spine  showed degenerative changes with chronic subluxation.  X-ray of the  thoracic spine showed degenerative  changes with no subluxation, fracture  or dislocation.  Because of his complaints of chest pain and this prior  history of coronary artery disease, he was admitted to the hospital to  rule out acute coronary syndrome.   HOSPITAL COURSE:  On admission he was placed on telemetry.  Serial CPK  and troponin were done q.8 h. x3.  EKG showed no acute ST-T wave  changes.  He received analgesics with IV morphine in the emergency room,  followed by Lidoderm patch to the chest wall and ibuprofen p.r.n. for  pain.  Nebulized albuterol 2.5 mg was given for wheezing or shortness of  breath.  His home medications were continued as previously taken.  SCD  stockings were put to the legs for DVT prophylaxis.  He was already on  Protonix 40 mg daily for GI prophylaxis.   Today he is still having some discomfort.  He is sitting on the edge of  the bed, not in acute respiratory or painful distress.  His vital signs are stable with a blood pressure 131/73, heart  rate of  65, respiratory rate of 18, temperature 98.2, O2 saturation on room air  is 98%.  His chest shows a few bibasal rhonchi.  There are no rales.  Heart sounds 1 and 2 are heard.  Abdomen is benign.  Extremities show no edema or calf tenderness.  He is alert and oriented x3 with no focal neurological deficits.   His daughter is by his bedside.  I have explained to him and his  daughter that he does not have any acute coronary syndrome at this time  and will be discharged back to the skilled nursing facility this  afternoon.   DISCHARGE DIAGNOSES:  1. Chest pain, acute coronary syndrome ruled out.  2. Multiple contusions including the back, neck, arms and legs.  3. Degenerative joint disease of the cervical, thoracic and lumbar      spines.   CONDITION ON DISCHARGE:  Stable.   DISPOSITION:  For skilled nursing facility.   MEDICATION ON DISCHARGE:  1. Amlodipine 5 mg daily.  2. Aricept 10 mg at bedtime.  3. Aspirin 325 mg daily.  4.  Enablex 7.5 mg daily.  5. Folic acid 1 mg daily.  6. Lorazepam 0.5 mg twice daily.  7. Megace 80 mg daily.  8. Meloxicam 7.5 mg daily.  9. Metoprolol 12.5 mg daily.  10.Seroquel 25 mg twice daily.  11.Zoloft 100 mg daily.  12.Flomax 0.4 mg daily.  13.Darvocet-N 100 one p.o. b.i.d. p.r.n. for pain.  14.Albuterol 2 puffs q.4 h. p.r.n. for shortness of breath or      wheezing.   This discharge plan has been discussed with him and his daughter and  their questions answered.  He will follow up with me in the office in  about 2 weeks from the nursing home if possible.      Fleet Contras, M.D.  Electronically Signed     Fleet Contras, M.D.  Electronically Signed    EA/MEDQ  D:  02/21/2009  T:  02/21/2009  Job:  [id number]

## 2011-01-05 NOTE — Discharge Summary (Signed)
NAME:  Bobby [name], Bobby [name] NO.:  [id number]   MEDICAL RECORD NO.:  [id number]          PATIENT TYPE:  INP   LOCATION:  3714                         FACILITY:  [facility]   PHYSICIAN:  Fleet Contras, M.D.    DATE OF BIRTH:  08/03/1928   DATE OF ADMISSION:  03/11/2008  DATE OF DISCHARGE:  03/15/2008                               DISCHARGE SUMMARY   HISTORY OF PRESENTING ILLNESS:  Bobby [name] is a 77 year old African-  American gentleman with past medical history significant for systemic  hypertension, chronic alcohol and tobacco abuse, dementia, depression,  chronic obstructive pulmonary disease, spontaneous pneumothorax,  degenerative joint disease of the knees, and colon cancer.  He was  brought to the emergency room at [facility] with onset of left-  sided chest pain in the morning, which started at about 4:00 a.m. which  woke him up from sleep.  It was sudden in onset associated with some  shortness of breath.  He said he felt like he was going to die.  The  pain was not radiating.  He had also had some cough with green sputum.  He denied any fevers or chills.  He had been smoking cigarettes and  drinking alcohol on a daily basis.  At the emergency room, he was in  mild painful distress requiring intravenous morphine for pain relief.  His vital signs were stable.  Initial laboratory data showed elevated CK-  MB and new inverted T-waves in the anterior chest leads.  He was  evaluated by cardiologist, Dr. Jens Som who started him on intravenous  nitroglycerin and heparin and advised for him to be admitted for cardiac  workup.   HOSPITAL COURSE:  On admission, the patient was put on the telemetry  bed.  He was started on IV nitroglycerin and IV heparin for stable  angina as he had multiple coronary risk factors.  Serial CKs and  troponin as well as EKG were performed.  The troponin was slightly  elevated up to 0.6, but the CK-MB did come down.  His BNP was 148, his  total bilirubin was 1.8, and albumin was 3.4.  Chest x-ray shows changes  of COPD with left lower lobe atelectasis.  He was started on Avelox 400  mg p.o. daily and nebulized albuterol and Atrovent for an exacerbation  of chronic obstructive pulmonary disease.  He was also started on a  delirium tremens protocol with lorazepam as well as IV hydration.  His  condition subsided as far as his pain and shortness of breath were  concerned.  He then underwent stress Myoview on March 12, 2008.  This  showed no obvious myocardial defect suggestive of ischemia or infarct.  Left ventricular ejection fraction was normal.  He was, therefore,  discontinued off of IV heparin and nitroglycerin, and he had no further  chest pains.  He had episode of agitation with most likely due to  alcohol withdrawal, and he was treated with intravenous lorazepam with  improvement.  Because of his level of dementia, alcohol abuse, and  dependence for his care, plans  were made for him to be transferred to  skilled nursing facility for rehabilitation.  This was discussed with  his family and with him, and it was agreed that it will be best for him  to go to a skilled nursing facility.  Arrangement has not been made for  this.  Once he is clinically stable, he will be discharged to a skilled  nursing facility.  He was started on aspirin, Lopressor, as well as  Zocor for cardioprotection, and he is to continue on these.   DISCHARGE DIAGNOSES:  1. Chest pain, acute coronary artery syndrome ruled out.  2. Acute exacerbation of chronic obstructive pulmonary disease,      improved on Avelox and nebulized bronchodilators.  3. Alcohol abuse with delirium tremens.  4. Dementia.  5. Failure to thrive with deconditioning.  6. Systemic hypertension.   DISCHARGE MEDICATIONS:  1. Norvasc 5 mg daily.  2. Aspirin 325 mg once a day.  3. Metoprolol 12.5 mg b.i.d.  4. Albuterol HFA 2 puffs every 6 p.r.n.  5. Atrovent HFA 2 puffs  every 6 p.r.n.  6. Zocor 20 mg p.o. daily.  7. Enablex 7.5 mg once a day.  8. Folic acid 1 mg p.o. daily.  9. Aricept 5 mg p.o. daily.  10.Zoloft 100 mg daily.  11.Celebrex 200 mg p.o. daily.  12.Risperdal 0.5 mg b.i.d. p.r.n. for agitation.   This plan of care was discussed with him and his family, and their  questions were answered.   CONDITION ON DISCHARGE:  Stable.   DISPOSITION:  To skilled nursing facility.   FOLLOWUP:  Followup will be with me in about 4-6 weeks for liver  function test.      Fleet Contras, M.D.  Electronically Signed     EA/MEDQ  D:  03/14/2008  T:  03/15/2008  Job:  [id number]

## 2011-01-08 NOTE — Op Note (Signed)
NAME:  Bobby [name], BANTZ NO.:  [id number]   MEDICAL RECORD NO.:  [id number]            PATIENT TYPE:   LOCATION:                                 FACILITY:   PHYSICIAN:  Salvatore Decent. Dorris Fetch, M.D. DATE OF BIRTH:   DATE OF PROCEDURE:  11/04/2005  DATE OF DISCHARGE:                                 OPERATIVE REPORT   PREOPERATIVE DIAGNOSIS:  Recurrent right spontaneous pneumothorax.   POSTOPERATIVE DIAGNOSIS:  Recurrent right spontaneous pneumothorax.   PROCEDURES:  1.  Right video-assisted thoracoscopy.  2.  Stapling of apical blebs.  3.  Pleural abrasion.   SURGEON:  Salvatore Decent. Dorris Fetch, M.D.   ASSISTANT:  Constance Holster, [place].   ANESTHESIA:  General   FINDINGS:  Apical blebs with adhesions. Severe anthracosis emphysema. No  other blebs visible from the pleural surface.   CLINICAL NOTE:  Bobby [name] is an elderly gentleman with a history of tobacco  abuse who presented recently with a recurrent right spontaneous  pneumothorax. His previous pneumothorax had been treated back in 2005 with  chest tube placement. He now returns with a recurrent pneumothorax. A chest  tube was placed, the lung re-expanded, and the air leak stopped.  An  extensive discussion was held with the patient's family. He has dementia and  history of ethanol use, and participated as possible. The alternatives were  to remove the chest tube, but with very high risk of recurrence versus VATS  with apical phlebectomy with the attendant risks of surgery, but a very low  risk of recurrence of his pneumothorax. After thought and discussion the  patient's family wished to proceed with surgery for definitive treatment.  They understood the risks and benefits.   OPERATIVE NOTE:  Bobby [name] was brought to the preop holding area on November 04, 2005; there, the Anesthesia Service placed an arterial blood pressure  monitoring line and ensured intravenous access. PASs were placed for DVT  prophylaxis,  and intravenous antibiotics were administered. He then was  taken to the operating room, anesthetized, and intubated with a double-lumen  endotracheal tube. A Foley catheter was placed. He was placed in the left  lateral decubitus position and the right chest was prepped and draped in the  usual fashion a single lung ventilation of the lung was carried out; and the  patient tolerated this well throughout the procedure. The right chest was  prepped and draped in usual fashion after removing the chest tube that was  in place.   A transverse incision was made in approximately the seventh intercostal  space in the mid axillary line. It was carried through the skin and  subcutaneous tissue. The chest was entered bluntly using a hemostat. A port  was inserted and the thoracoscope was placed through the port. There  initially was a significant amount of residual and trapped air in the lung.  Suction was applied to the right side of the dual lumen endotracheal tube;  and there was good deflation of the lung with the suction catheter in place.  There was severe  anthracosis over the visceral pleural surface. There were  some adhesions that were taken down with electrocautery. There was extensive  bleb formation at the apex which had been apparent on CT scan. There no  other blebs visible on the visceral pleural surface with inspection of the  lung, specifically, none in the superior segment of the right lower lobe.  The adhesions were taken at the apex.   Additional incisions were made in the anterior axillary line and posterior  axillary line in approximately the fifth intercostal space after  instrumentation. The apical portion along with the blood formation then was  grasped and apical phlebectomy was performed with two firings of an Echelon  60 stapler using a yellow staple load. The specimen, then, was removed. The  staple line was inspected. There was good hemostasis. The apex of the lung   in the area around the staple line were carefully inspected to ensure that  there were no phlebs that had been missed. The parietal pleural surface then  was lightly abraded using a cautery scratchpad.  A 28-French chest tube was  placed through the patient's previous chest tube site and placed at the  apex. The lung then was reinflated. As the lung was reinflated, the scope  was removed. The remaining 3 incisions were closed with a #1 Vicryl fascial  suture and a 3-0 Vicryl subcuticular suture. All sponge, needle, and  instrument counts were correct at the end of the procedure. The patient was  extubated in the operating room and taken to the recovery room in stable  condition.           ______________________________  Salvatore Decent Dorris Fetch, M.D.     SCH/MEDQ  D:  11/04/2005  T:  11/06/2005  Job:  [id number]

## 2011-01-08 NOTE — Discharge Summary (Signed)
NAMEMarland [name]  Bobby, [name] NO.:  [id number]   MEDICAL RECORD NO.:  [id number]          PATIENT TYPE:  INP   LOCATION:  2040                         FACILITY:  [facility]   PHYSICIAN:  Salvatore Decent. Dorris Fetch, M.D.DATE OF BIRTH:  06-27-1928   DATE OF ADMISSION:  10/31/2005  DATE OF DISCHARGE:  11/12/2005                                 DISCHARGE SUMMARY   ADMISSION DIAGNOSIS:  Recurrent right spontaneous pneumothorax.   DISCHARGE DIAGNOSES:  1.  Recurrent right spontaneous pneumothorax with apical blebs, status post      bleb stapling.  2.  Pleural abrasion.  3.  Dementia.  4.  Chronic obstructive pulmonary disease/anthracosis emphysema.  5.  Previous right spontaneous pneumothorax in 2005.  6.  Hypertension.  7.  Degenerative joint disease.  8.  History of alcohol abuse with symptoms of alcohol withdrawal this      hospitalization.  9.  History of ongoing tobacco abuse.  10. History of bladder spasm.  11. History of multiple traumatic events.  12. History of major abdominal surgery, patient unsure of specifics.   ALLERGIES:  No known drug allergies.   PROCEDURES:  1.  On October 31, 2005, insertion of a 20-French right chest tube.  2.  On November 04, 2005, right video-assisted thoracoscopy with stapling of      apical blebs and pleural abrasion, by Dr. Viviann Spare C. Hendrickson.   HISTORY OF PRESENT ILLNESS:  Bobby [name] is a 77 year old African/American  male with a history of chronic obstructive pulmonary disease and tobacco  abuse, with a previous right spontaneous pneumothorax, treated with a chest  tube by Dr. Ines Bloomer in 2005.  He presented to [facility] on October 31, 2005, with progressive shortness of breath over the  course of the day.  He also reports a cough, as well as some chills.  Denied  any trauma.  In the emergency room his electrocardiogram showed a normal  sinus rhythm with no acute changes.  A chest x-ray revealed large right  pneumothorax.   HOSPITAL COURSE:  On October 31, 2005, [name] was admitted to [facility] for a recurrent spontaneous right pneumothorax.  He was  evaluated by the cardiac/thoracic surgeon, Dr. Dorris Fetch and a right chest  tube was placed.  Because this was his second occurrence, a chest CT was  obtained which showed moderate bullous emphysema with some pleural blebs  noted in the right lung apex and medial right lower lobe and azo-esophageal  recess.  There was also acute air space disease within the right upper lobe,  middle and lower lobes, consistent with pneumonia; however, he remained  afebrile with a relatively normal white count.  Dr.  Dorris Fetch felt that  this rather represented residual atelectasis.  Due to the finding of right  lung blebs, Dr. Dorris Fetch recommended a right video-assisted thoracoscopic  procedure with bleb stapling and pleurodesis, to decrease the likelihood of  recurrence.  After discussing this and other options with the patient's  daughter, Bobby [name], who is his power of attorney, she and  the patient agreed  to proceed and the patient was taken to the operating room on November 04, 2005.  Postoperatively he was transferred to the step-down unit 3300.  From  a surgical standpoint, he remained stable and his chest tube was removed on  November 07, 2005.  A chest x-ray showed no pneumothorax and he had no  significant output, and no evidence of air leak.  His primary issue  postoperatively was confusion, which was felt secondary to delirium tremens,  with his history of alcohol abuse.  Of note, he does have baseline dementia.  His confusion was managed with Ativan or Haldol p.r.n.  He also required a  short-term use of four-point restraints and a Posey vest; however, over  several days his mentation seemed to improve overall.  He was then  transitioned to a Posey vest only and at this point has been okay without  restraints, although a bed alarm  has been used.  He has remained  hemodynamically stable and afebrile.  His oxygen saturations have been 97%  on room air.  His latest chest x-ray on November 09, 2005, showed no  pneumothorax, with bibasilar atelectasis and stable right effusion.   His postoperative labs have also been stable, showing a white blood cell  count of 4.8, hemoglobin 10.8, hematocrit 31.5, platelet count 144.  Sodium  133, potassium 3.5, chloride 105, CO2 of 23, BUN 2, creatinine 0.8 and blood  glucose 119.  Liver function tests were normal with an AST of 38, ALT 18,  alkaline phosphatase 48, total bilirubin of 0.6.  His blood albumin was  decreased at 2.2.   His pain was controlled on oral medications.  His incisions were healing  well without signs of infection.  His bowel and bladder were functioning  appropriately.  A condom catheter was utilized during the hospitalization.  He was able to mobilize with supervision.  In 24 hours following the removal  of his chest tube, Bobby [name] was felt stable from a surgical standpoint;  however, due to the patient's history of alcohol abuse with underlying  dementia, his daughters were concerned about his living situation.  They  desired a skilled nursing placement.  Subsequently a case management and  social worker consultations were placed, and at this time the social worker  is pursing nursing home options.  Once a bed is made available, and if Mr.  [name] has remained stable without the use of restraints, it is anticipated  that he will be ready for discharge by March 22nd or November 12, 2005.   Other issues addressed during his hospitalization included hypertension, for  which a clonidine patch was ordered; as well as his tobacco abuse, for which  a nicotine patch was prescribed.   DISCHARGE MEDICATIONS:  1.  Norvasc 5 mg p.o. daily.  2.  Prevacid 30 mg p.o. daily.  3.  Aspirin 325 mg p.o. daily.  4.  Celebrex 200 mg p.o. daily.  5.  Folic acid 1 mg p.o.  daily. 6.  Caltrate plus, one tab p.o. b.i.d.  7.  Enablex 7.5 mg p.o. daily.  8.  Multivitamin, one p.o. daily.  9.  Thiamine 100 mg, one p.o. daily.  10. A clonidine patch (Catapres), 0.1 mg (transdermal), q. seven days.  Next      change due on November 13, 2005.  11. Senokot-S, two tab p.o. q.h.s.  12. A nicotine patch 14 mg transdermal q.24h.  13. Ativan 1 mg p.o. q.6h. p.r.n. agitation.  14. Ultram 50 mg, one to two tab p.o. q.6h. p.r.n. pain.   DISCHARGE INSTRUCTIONS:  1.  He is to follow a heart-healthy diet.  2.  He should receive Ensure supplements, one can p.o. t.i.d.  3.  He may be out of bed as tolerated with supervision.  4.  He is to avoid heavy lifting for the next two weeks.  5.  He may shower and clean his incision gently with soap and water.  6.  [facility] Office should be notified if he develops an unexplained fever of      greater than 101 degrees or redness or drainage from his incision site,      or increasing shortness of breath.   FOLLOWUP:  1.  He is to follow up with Dr. Dorris Fetch at [facility] on Monday, November 15, 2005, at 11 a.m.  2.  He is to have a chest x-ray one hour before the above appointment.  This      has been scheduled for November 15, 2005, at 10 a.m. at [facility]).  He should bring his chest x-ray with him      to the CVTS Office.      Jerold Coombe, P.A.    ______________________________  Salvatore Decent Dorris Fetch, M.D.    AWZ/MEDQ  D:  11/10/2005  T:  11/10/2005  Job:  [id number]

## 2011-01-08 NOTE — Discharge Summary (Signed)
NAMEHARSHITH, [name] NO.:  [id number]   MEDICAL RECORD NO.:  [id number]                   PATIENT TYPE:  INP   LOCATION:  5725                                 FACILITY:  [facility]   PHYSICIAN:  Ines Bloomer, M.D.              DATE OF BIRTH:  [birth year]   DATE OF ADMISSION:  03/06/2004  DATE OF DISCHARGE:  03/09/2004                                 DISCHARGE SUMMARY   ADMISSION DIAGNOSIS:  Spontaneous right pneumothorax with component of  tension, first occurrence.   DISCHARGE/SECONDARY DIAGNOSES:  1. Spontaneous right pneumothorax with component of tension, first     occurrence, status post right chest tube.  2. History of alcoholism.  3. History of tobacco dependence.  4. History of chronic obstructive pulmonary disease.  5. Hypertension.  6. Chronic sinusitis.  7. Arthritis.  8. History of dementia.  9. History of right wrist fracture, status post repair.  10.      History of colon cancer, status post resection in 2002.  11.      History of bilateral toe surgery for ingrown toenails.  12.      History of fall several weeks ago with resulting right forehead     laceration.  13.      History of delirium tremens.   PRIMARY PHYSICIAN:  Dr. Fleet Contras.   HISTORY OF PRESENT ILLNESS:  Bobby [name] is a 77 year old African American  male who presented to the [facility] Emergency Department on March 06, 2004 with worsening right-sided chest pain and shortness of breath over  the past 4 days.  He also had developed a rather persistent cough, both dry  and occasionally with yellow sputum production.  He also reported 1 episode  of blood-tinged sputum.  He denied fever or chills.  He was also without  nausea or vomiting or abdominal pain.  A chest x-ray was obtained in the  emergency department which showed a right-sided moderate-to-large  pneumothorax with a component of tension with mediastinal shift to the left.  He was evaluated by Dr.  Ines Bloomer from [facility] and a right chest tube  was placed with marked decrease in his right pneumothorax.   HOSPITAL COURSE:  On March 06, 2004, Bobby [name] was admitted to [facility] with a spontaneous right pneumothorax.  A chest tube was placed in  the emergency department with a marked decrease in his right pneumothorax.  He also had symptomatic relief.  He was admitted to the floor and restarted  on his home medications.  Folic acid and Haldol twice daily were added  secondary to his history of alcoholism.  The following morning, his followup  chest x-ray showed a less than 5% right pneumothorax.  He also underwent a  chest CT later that day which showed right anteromedial residual  pneumothorax estimated at approximately 10% to 15%.  At that time, his right  chest tube was noted to be within the soft tissues outside the thoracic  cavity.  There was right lower lobe dependent atelectasis and/or airspace  disease.  There was minimal left lower lobe atelectasis.  There was severe  upper lobe prominent emphysema and bullous disease.  Since his chest tube  was noted to be in the soft tissues, it was removed on March 08, 2004.  Followup chest x-rays showed no significant change in his small right apical  pneumothorax.   On March 09, 2004, no pneumothorax was identified by x-ray.  He was afebrile  with stable vital signs with a heart rate of 86, respirations 20, blood  pressure 137/76.  He was saturating 97% on room air.  He was without labored  breathing.  His old chest tube site had a small amount of serosanguineous  drainage but no signs of infection.  He was tolerating an oral diet and was  ambulating in the hallways with assistance by using his cane.  He denied any  pain significant enough to require pain medication.  Based on his progress,  he was felt stable for discharge.  However, once this information was  relayed to his family, they were concerned about his history of  alcoholism.  They did request information on alcohol community resources as well as  possible rest home or assisted living facilities.  This information was  provided to his daughter Bobby [name], who is also his power of attorney.  She  was also instructed to contact his Medicaid social worker to discuss  possible eligibility.  The family also expressed concern about the state of  mind.  As a baseline, he does have mild dementia.  When examined by Dr.  Edwyna Shell, the patient was noted to be alert and oriented x3, however, the  nursing staff reported he did have episodes of confusion throughout the day,  specifically regarding orientation to place.  However, he was not noted to  be harmful to self or others.  He was also not noted to have hallucinations.  He did express a desire to be discharged home.  He was felt medically  appropriate as well as competent to make decisions.  Therefore, Dr. Edwyna Shell  felt it was appropriate for him to be discharged home as planned.  There  were issues of obtaining a ride home, however, a cab doctor was arranged and  his daughter Bobby [name] agreed to take him into her care overnight.  His  discharge instructions were reviewed with both the patient as well as his  daughter Bobby [name] over the phone.  Bobby [name] was instructed that she may  contact the [facility] if once Bobby [name] was home, he was felt  a danger to self or others.   LABORATORY DATA:  Hepatic function panel on March 08, 2004 showed a total  bilirubin of 1.4, direct bilirubin of 0.4, indirect bilirubin of 1.0,  alkaline phosphatase 65, SGOT 26, SGPT 13, total protein 7.0, albumin 2.7.  Labs on March 06, 2004 showed a sodium of 139, potassium 4.3, chloride 97,  CO2 26, glucose 84, BUN 15, creatinine 1.2, calcium 10.4; white blood cell  count 6.7, hemoglobin 15.5, hematocrit 46.5 and platelet count 152,000.  DISCHARGE MEDICATIONS:  He is to continue his home medications which  include:  1. Norvasc 5  mg 1 p.o. daily.  2. Prevacid 30 mg 1 p.o. daily.  3. Celebrex 200 mg 1 p.o. daily p.r.n.  4. Centrum multivitamin  1 p.o. daily.  5. Aspirin 81 mg 1 p.o. daily.  6. Folic acid 1 mg p.o. daily.  7. Calcium supplements daily as directed.  8. Tylenol 325 mg 1-2 tablets q.4 h. p.r.n. pain.   ACTIVITY:  He is instructed to avoid heavy lifting, driving or strenuous  activity at least until reevaluated by Dr. Edwyna Shell at the CVTS office.   DIET:  He may resume his pre-hospitalization diet.   WOUND CARE:  He may shower daily with soap and water.  He is to notify the  CVTS office for fever greater than 101, redness or drainage at his chest  tube insertion site or increasing shortness of breath.   FOLLOWUP:  He is to follow up with Dr. Edwyna Shell at the CVTS office in 7-10  days.  He will also have a chest x-ray 1 hour before this appointment at the  [facility] and was instructed to bring his x-ray films  with him to the appointment with Dr. Edwyna Shell.  The CVTS office will contact  him with a specific appointment date and time.      Jerold Coombe, P.A.                  Ines Bloomer, M.D.    AWZ/MEDQ  D:  03/09/2004  T:  03/10/2004  Job:  [id number]   cc:   Ines Bloomer, M.D.  [address]  [place]  [place] [address]   Fleet Contras, M.D.  [address]  [place]  [place] [address]  Fax: [phone]

## 2011-01-08 NOTE — H&P (Signed)
NAMEJAVIN, NONG NO.:  [id number]   MEDICAL RECORD NO.:  [id number]                   PATIENT TYPE:  INP   LOCATION:  4740                                 FACILITY:  [facility]   PHYSICIAN:  Ines Bloomer, M.D.              DATE OF BIRTH:  1928/08/12   DATE OF ADMISSION:  03/06/2004  DATE OF DISCHARGE:                                HISTORY & PHYSICAL   CHIEF COMPLAINT:  Right-sided chest pain and shortness of breath x3-4 days.   PRIMARY CARE PHYSICIAN:  Fleet Contras, M.D.   HISTORY OF PRESENT ILLNESS:  [name] is a 77 year old African-American  male who presented today to the [facility] emergency department  with worsening right-aided chest pain and shortness of breath over the past  four days.  He had also developed a rather persistent cough, both dry and  occasionally with a yellow sputum production.  He also reported one episode  of blood-tinged sputum yesterday.  Otherwise, he denied fever or chills.  He  was also without nausea, vomiting, or abdominal pain.  His past medical  history is significant for tobacco dependence and COPD.   PAST MEDICAL HISTORY:  1. Hypertension.  2. COPD.  3. Chronic sinusitis per head CT in June.  4. Arthritis.  5. History of dementia.  Head CT in June 2005 showed moderate to marked     diffuse cerebral and cerebellar atrophy with old right thalamic lacunar     infarct.  There was also mild small-vessel white matter ischemic changes     in both the cerebral hemispheres.  6. History of right wrist fracture, status post repair.  7. History of colon cancer, status post resection in 2002.  8. History of bilateral toe surgery for ingrown toenails.  9. History of a fall several weeks ago with a resulting right forehead     laceration, status post closure with sutures.  10.      Tobacco dependence.  11.      History of alcohol dependence with withdrawal.  He now reports only     one serving of liquor  weekly.   He specifically denies any history of diabetes mellitus or kidney disease or  known coronary artery disease.   ALLERGIES:  He has no known drug allergies.   MEDICATIONS:  Below is a list of his medications; however, doses are  unknown.   1. Norvasc daily.  2. Prevacid daily.  3. Multivitamin daily.  4. Folic acid daily.  5. Celebrex daily.  6. Ferrous sulfate daily.   SOCIAL HISTORY:  He is widowed with five or six children.  At least two of  his children live in the area.  He is widowed approximately two years and  now lives with a girlfriend.  He does not have a driver's license.  He is  retired from [employer], for which  he worked for 28 years.  He smoked one  pack of cigarettes per day since approximately age 75.  Several years ago he  had a history of drinking one fifth of liquor daily.  Now he reports he  drinks only one serving of liquor per week.   FAMILY HISTORY:  His mother died of a stroke at age 19.  His father died of  emphysema at age 58.   REVIEW OF SYSTEMS:  Please refer to history of present illness for pertinent  positives and negatives.  In addition, he does wear eyeglasses.  He also  reports constipation but no nausea, vomiting, or diarrhea.  He denies  hematochezia.  He does report approximately a five-pound weight loss over  the past few weeks.  His daughter reports that he does have a decreased oral  intake.  He does not have dentures and therefore requires soft food.  He  denies any urinary symptoms such as dysuria or incontinence.  He denies any  dizziness or syncope.  He does report generalized joint aches and pains.  He  denies any history of DVT or pulmonary embolism.   PHYSICAL EXAMINATION:  VITAL SIGNS:  Blood pressure 121/74, heart rate 106,  respirations 24, temperature 96.8, oxygen saturation 92% on 3 L per nasal  cannula.  GENERAL:  Bobby [name] is a 77 year old African-American male who is alert,  cooperative, and in no acute  distress.  Exam was performed status post his  right chest tube placement.  HEENT:  His head is normocephalic.  His pupils are equal, round, and  reactive to light and accommodation.  His right forehead just above his  eyebrow does have a small laceration with approximately four sutures.  This  area appears to be healing well and was without erythema or drainage.  His  oral mucous membranes are slightly dry.  He has several missing teeth with  no upper teeth noted and only a few lower teeth noted on the right.  No  lesions or erythema were noted.  NECK:  Supple, no thyromegaly or lymphadenopathy was noted.  He does have 2+  carotid pulses.  No bruits were auscultated.  CHEST:  His breathing is nonlabored.  His breath sounds are clear but  diminished, particularly on the right.  No wheezes, rhonchi, or rales were  noted.  He does have a right chest tube on 20 cm suction.  There is minimal  drainage.  No air leak is noted.  His chest dressing is clean, dry, and  intact.  CARDIAC:  His heart has a regular rate and rhythm; however, his heart sounds  are distant.  No murmur, rub, or gallop was noted.  ABDOMEN:  His abdomen is soft, nondistended, and nontender.  He does have  normoactive bowel sounds x4 quadrants.  He does have a midline well-healed  incisional scar.  No hepatosplenomegaly was noted.  GENITOURINARY, RECTAL:  These exams were deferred.  EXTREMITIES:  His extremities are warm and without edema.  He does have 2+  radial pulses, 2+ dorsalis pedis pulses bilaterally, and 1+ posterior tibial  pulses bilaterally.  He does have thick toenails bilaterally.  His bilateral  hands also show mild deformity, appeared to be related to arthritic changes.  He does have a right wrist scar.  NEUROLOGIC:  Bobby [name] is alert.  He is oriented to person, place, and  month.  He was unable to recall the year.  He is somewhat forgetful.  He also appears to  neglect his left side, specifically his left  arm and left  leg; however, when testing strength, no asymmetry was noted.  He does  ambulate with a cane; however, this was unobserved.  His speech was clear.   RADIOLOGY:  The emergency department chest x-ray showed a right-sided  moderate to large pneumothorax with a component of tension, with mediastinal  shift toward the left.   IMPRESSION:  Bobby [name] is a 77 year old African-American male with history  of tobacco dependence and chronic obstructive pulmonary disease, who  presents with a right tension pneumothorax, first occurrence.   PLAN:  A right chest tube was placed in the emergency department by Dr.  Edwyna Shell.  He will be admitted to unit 4700 at [facility].  Initially  his chest tube will be placed to 20 cm suction.  Will anticipate also  obtaining a chest CT scan during this hospitalization.      Jerold Coombe, P.A.                  Ines Bloomer, M.D.    AWZ/MEDQ  D:  03/06/2004  T:  03/06/2004  Job:  [id number]

## 2011-01-08 NOTE — Discharge Summary (Signed)
NAMESIR, MALLIS NO.:  [id number]   MEDICAL RECORD NO.:  [id number]                   PATIENT TYPE:  INP   LOCATION:  3737                                 FACILITY:  [facility]   PHYSICIAN:  Greggory Stallion L. Pernell Dupre, M.D.               DATE OF BIRTH:  1928-05-13   DATE OF ADMISSION:  02/10/2003  DATE OF DISCHARGE:  02/11/2003                           DISCHARGE SUMMARY - REFERRING   DISCHARGE DIAGNOSES:  1. Chest pain/diaphoresis with negative cardiac enzymes and normal     electrocardiogram.  2. Elevated amylase without nausea, without right upper quadrant tenderness.   SECONDARY DIAGNOSES:  1. Hypertension.  2. Dementia.  3. History of colon cancer, status post resection two years ago.  4. Active tobacco habituation.  5. Active ethanol use, one-fifth of ethanol daily.   CONSULTATIONS:  Cardiac consult for acute  coronary syndrome was negative  since cardiac enzymes x 2, both creatinine kinase and troponin I were  negative.  Electrocardiogram showed no acute changes.  The patient at the  time that he was seen in consultation by Dr. Veneda Melter was not complaining  of chest pain or diaphoresis or shortness of breath.  He had no nausea or  vomiting.  No radiation of his pain.  He was judged to be suitable candidate  for discharge.  He goes home with his home medications which are:   1. Enteric-coated aspirin 81 mg daily.  2. Norvasc 5 mg daily.  3. Centrum Silver daily.  4. I suggest Pepcid AC 10 mg two tabs daily to help control stomach acid.  5. Folic acid 400 mcg two tabs daily.   PAIN MANAGEMENT:  Not applicable.   ACTIVITY:  No restrictions.   DISCHARGE DIET:  Low-sodium, low-cholesterol diet.   WOUND CARE:  Not applicable.   Office visit in one to two weeks with his primary caregiver.   BRIEF HISTORY:  Bobby [name] is a 77 year old African-American male with a  history of colon cancer.  He had resection of his colon two years ago.  He  has a history of hypertension, dementia.  He presented on June 20 with chest  pain and abdominal pain.  He states his problems started about 8 o'clock in  the evening on June 20 after eating some soup.  He called his daughter.  She  went to pick him up.  He began complaining of chest pain.  On the way to the  hospital, he states he had pain in the left shoulder which was described as  an aching.  He states it hurts just a little bit.  There is no radiation of  the pain, no shortness of breath, no nausea or vomiting.  He did have some  diaphoresis.  No history of paroxysmal nocturnal dyspnea, no orthopnea.  The  patient states he cannot walk more than one-half block because of  fatigue,  shortness of breath and balance problems.  He denies chest pain, however, as  one of his symptoms when walking.  He denies belching or burning after  eating.  No pain to palpation over the chest.   HOSPITAL COURSE:  As [facility] he was admitted with diagnosis of  chest pain, abdominal pain.  Cardiac enzymes were obtained.  These were  negative.  An electrocardiogram was taken.  This was normal sinus rhythm  without electrocardiographic changes.  He has not been having chest pain  through the day today, February 11, 2003.  He has been restarted on his home  medications.  He has been placed on a detoxification protocol due to his  heavy alcohol use.  He has not had any severe mental status changes or any  symptoms consistent with delirium tremens.  Serum amylase was 270 which was  elevated.  He does not have right upper quadrant tenderness.  He is not  having any nausea or vomiting.  He may go home with the medications as  described above, and it is recommended that he follow up with  Dr. Concepcion Elk in one to two weeks.     Maple Mirza, P.A.                    Greggory Stallion L. Pernell Dupre, M.D.    GM/MEDQ  D:  02/11/2003  T:  02/12/2003  Job:  [id number]   cc:   Veneda Melter, M.D.   Fleet Contras, M.D.   [address]  [place]  [place] [address]  Fax: [phone]    cc:   Veneda Melter, M.D.   Fleet Contras, M.D.  [address]  [place]  [place] [address]  Fax: [phone]

## 2011-01-08 NOTE — H&P (Signed)
NAMELAVANTE, TOSO NO.:  [id number]   MEDICAL RECORD NO.:  [id number]                   PATIENT TYPE:  INP   LOCATION:  3737                                 FACILITY:  [facility]   PHYSICIAN:  Greggory Stallion L. Pernell Dupre, M.D.               DATE OF BIRTH:  Jan 30, 1928   DATE OF ADMISSION:  02/10/2003  DATE OF DISCHARGE:                                HISTORY & PHYSICAL   CHIEF COMPLAINT:  Chest pain.   HISTORY OF PRESENT ILLNESS:  This is a 77 year old black male with a history  of colon cancer status post resection two years ago, hypertension, and  dementia, who presents complaining of chest pain and abdominal pain.  The  patient is a very poor historian.  Most of the history is gained from his  daughter, Ms. Nazire Fruth, at [phone] or [phone].  The patient states  that the problem started at 8 p.m. with pain in the abdomen after eating  soup.  The daughter went and picked the patient up and he began complaining  of chest pain on the way to the hospital.  The patient states he had pain in  the left shoulder, he had an achy sensation and it hurt just a little bit.  No radiation, no shortness of breath, no nausea or vomiting.  He did have  diaphoresis.  No PND, no orthopnea, and no lower extremity edema.  The  patient states that he cannot walk more than one-half block before becoming  fatigued, short of breath and occasionally falls.  Supposedly, he has bad  balance.  The patient denies any chest pain with exercise.  He denies any  belching or burning after eating and no pain to palpation over his chest.   PAST MEDICAL HISTORY:  1. Colon cancer status post resection two years ago.  2. Hypertension.  3. Dementia.   FAMILY HISTORY:  His mother died of CVA at 5.  His father died of emphysema  at 49.   SOCIAL HISTORY:  One pack per day times 60 years.  A fifth of liquor a day.  No IV drug abuse.  He has a history of withdrawal.   ALLERGIES:  No known drug  allergies.   MEDICATIONS:  1. Norvasc 5 q.day.  2. Multivitamin 1 q.d.   REVIEW OF SYSTEMS:  Completely negative.   EKG shows normal sinus rhythm at a rate of 64 with LVH with early recall.  A  chest x-ray is pending.  Laboratory-wise, most labs are still pending but a  CBC shows a white count of 3.4, H&H 12.8/37.3, platelet count 113.  MCV 92,  PTT is 14.1, INR 1.1.   PHYSICAL EXAMINATION:  GENERAL:  Alert and oriented, smelling of alcohol.  Poor historian.  VITAL SIGNS:  Pulse 73, blood pressure 122/76.  HEENT:  No significant JVP, no carotid bruits or thyromegaly.  He does have  exophthalmus.  CHEST:  Coarse breath sounds at the bases.  CARDIOVASCULAR:  Normal S1 and S2.  No murmurs, rubs or gallops.  ABDOMEN:  Soft, midline scar, nontender, nondistended, no bruits.  EXTREMITIES:  No clubbing, cyanosis or edema.  Showing radial, femoral and  pedal pulses.   ASSESSMENT:  A 77 year old black male with a history of hypertension and  dementia, brought in by daughter because of abdominal pain converting to  chest pain.   PLAN:  1. History of chest pain.  The patient is a poor historian but considering     subjective and objective findings more likely to be GI rather than     cardiac in origin.  We will rule out times three, consider a     transthoracic echo in the a.m. considering complaints of shortness of     breath and fatigue.  ECG remarkable for LVH.  We will check his LFTs.  We     will not give anti-thrombotic treatment at this time unless he rules in     by cardiac enzymes.  Continue aspirin, 81 q.day, check his amylase and     lipase for pancreatitis.  2. Hypertension.  Continue Norvasc.  3. Alcohol abuse.  Place him on DT prophylaxis.  4. He is full code.                                               Greggory Stallion L. Pernell Dupre, M.D.    Loralie Champagne  D:  02/11/2003  T:  02/11/2003  Job:  [id number]

## 2011-01-08 NOTE — H&P (Signed)
NAME:  Bobby [name], Bobby [name] NO.:  [id number]   MEDICAL RECORD NO.:  [id number]          PATIENT TYPE:  INP   LOCATION:  5032                         FACILITY:  [facility]   PHYSICIAN:  Salvatore Decent. Dorris Fetch, M.D.DATE OF BIRTH:  October 05, 1927   DATE OF ADMISSION:  10/31/2005  DATE OF DISCHARGE:                                HISTORY & PHYSICAL   CHIEF COMPLAINT:  Shortness of breath.   HISTORY OF PRESENT ILLNESS:  Bobby [name] is a 77 year old African-American  male with a history of COPD, who has had a previous right pneumothorax  treated with a chest tube by Dr. Edwyna Shell in 2005.  He presents today with  progressive shortness of breath over the course of the day.  He has had a  cough.  He also complains of some chills.  His shortness of breath is worse  when he lies flat.  He denies any trauma.  In the emergency room, an EKG  showed normal sinus rhythm with no acute changes.  Chest x-ray showed a  large right pneumothorax.   PAST MEDICAL HISTORY:  1.  Dementia.  2.  COPD.  3.  Right spontaneous pneumothorax.  4.  Hypertension.  5.  Degenerative joint disease.  6.  History of ethanol abuse.  7.  History of tobacco abuse.  8.  History of bladder spasm.  9.  He has had multiple traumatic events.  10. He has also had a major abdominal surgery.  He is unsure of the type.   MEDICATIONS:  1.  Norvasc 5 mg daily.  2.  Prevacid 30 mg daily.  3.  Enteric-coated aspirin 325 mg daily.  4.  Celebrex 200 mg daily.  5.  Folic acid 1 mg daily.  6.  Caltrate Plus 1 tablet p.o. b.i.d.  7.  Enablex 7.5 mg p.o. daily.   He has no known drug allergies.   FAMILY HISTORY:  Noncontributory.   SOCIAL HISTORY:  He has dementia.  He lives with his daughter.  She has the  power of attorney.  He has a long history of tobacco abuse.  He actually  smoked up until today.   REVIEW OF SYSTEMS:  Chills, shortness of breath, orthopnea.  Denies any  trauma.  Patient has had mild right-sided  pleuritic chest pain.  All other  systems are negative.   PHYSICAL EXAMINATION:  VITAL SIGNS:  Blood pressure 132/80, pulse 75 and  regular, respirations 30.  GENERAL:  Bobby [name] is a 77 year old African-American male with mild  respiratory distress.  He is thin.  He has cachexia.  NEUROLOGIC:  He is oriented to person and place.  He has no focal motor  deficits.  NECK:  There is no  LUNGS:  Markedly diminished breath sounds on the right side.  There is no  tracheal deviation.  There is no wheezing.  There is no subcutaneous  emphysema.  HEART:  Regular rate and rhythm.  Normal S1 and S2.  No murmurs, rubs or  gallops.  ABDOMEN:  Soft and nontender.  He has a well-healed midline laparotomy scar.  EXTREMITIES:  No edema.  There are 2+ pulses bilaterally.  There is no  evidence of external trauma.   Chest x-ray shows a large right pneumothorax.   IMPRESSION:  Bobby [name] is a 77 year old gentleman with chronic obstructive  pulmonary disease, who presents with a current right pneumothorax.  He needs  tube thoracostomy for immediate re-expansion.  I discussed with the patient,  who has limited understanding, but also his daughter, who is his power of  attorney, the indications, risks, benefits and alternatives.  They  understand the risks, which primarily are bleeding or misplacement of the  tube.  Once the tube is placed, he will be admitted and then will plan to do  a CT of the chest tomorrow to assess the severity of his chronic obstructive  pulmonary disease and help guide operative therapy, should that be  necessary.           ______________________________  Salvatore Decent Dorris Fetch, M.D.     SCH/MEDQ  D:  10/31/2005  T:  11/01/2005  Job:  [id number]

## 2011-05-21 LAB — BASIC METABOLIC PANEL
BUN: 11
Calcium: 8.5
Calcium: 8.7
Chloride: 99
Creatinine, Ser: 0.83
GFR calc Af Amer: >60
GFR calc non Af Amer: >60
Sodium: 136

## 2011-05-21 LAB — CBC
HCT: 37.2 — ABNORMAL LOW
HCT: 46.3
Hemoglobin: 12.4 — ABNORMAL LOW
MCHC: 32.8
MCHC: 33.2
MCHC: 33.3
MCV: 93
MCV: 93.6
MCV: 94.3
MCV: 94.6
Platelets: 129 — ABNORMAL LOW
Platelets: 136 — ABNORMAL LOW
Platelets: 148 — ABNORMAL LOW
Platelets: 154
Platelets: 164
RDW: 17 — ABNORMAL HIGH
RDW: 17.3 — ABNORMAL HIGH
RDW: 17.4 — ABNORMAL HIGH
RDW: 18.2 — ABNORMAL HIGH
WBC: 5.9
WBC: 7.2

## 2011-05-21 LAB — CARDIAC PANEL(CRET KIN+CKTOT+MB+TROPI)
CK, MB: 6.3 — ABNORMAL HIGH
CK, MB: 6.4 — ABNORMAL HIGH
Total CK: 137
Troponin I: 0.06
Troponin I: 0.1 — ABNORMAL HIGH

## 2011-05-21 LAB — DIFFERENTIAL
Basophils Absolute: 0
Lymphocytes Relative: 18
Monocytes Absolute: 0.4
Monocytes Relative: 6
Neutro Abs: 5.1
Neutrophils Relative %: 75

## 2011-05-21 LAB — POCT CARDIAC MARKERS
Myoglobin, poc: 92.6
Operator id: 272551
Operator id: 295021
Troponin i, poc: <0.05
Troponin i, poc: <0.05

## 2011-05-21 LAB — LIPID PANEL
HDL: 56
Total CHOL/HDL Ratio: 1.8

## 2011-05-21 LAB — HEPARIN LEVEL (UNFRACTIONATED)
Heparin Unfractionated: 0.43
Heparin Unfractionated: 0.51

## 2011-05-21 LAB — URINALYSIS, ROUTINE W REFLEX MICROSCOPIC
Bilirubin Urine: NEGATIVE
Ketones, ur: NEGATIVE
Nitrite: NEGATIVE
Protein, ur: NEGATIVE
Urobilinogen, UA: 0.2

## 2011-05-21 LAB — COMPREHENSIVE METABOLIC PANEL
Albumin: 3.4 — ABNORMAL LOW
BUN: 8
Creatinine, Ser: 0.76
Total Bilirubin: 1.8 — ABNORMAL HIGH
Total Protein: 7.7

## 2011-05-21 LAB — CK TOTAL AND CKMB (NOT AT ARMC)
CK, MB: 7 — ABNORMAL HIGH
Relative Index: 6.5 — ABNORMAL HIGH

## 2011-05-25 ENCOUNTER — Emergency Department (HOSPITAL_COMMUNITY)
Admission: EM | Admit: 2011-05-25 | Discharge: 2011-05-25 | Disposition: A | Discharge status: Home / Self Care | Payer: Medicare Other | Attending: Emergency Medicine | Admitting: Emergency Medicine

## 2011-05-25 ENCOUNTER — Emergency Department (HOSPITAL_COMMUNITY): Payer: Medicare Other

## 2011-05-25 DIAGNOSIS — L0201 Cutaneous abscess of face: Secondary | ICD-10-CM | POA: Insufficient documentation

## 2011-05-25 DIAGNOSIS — H5789 Other specified disorders of eye and adnexa: Secondary | ICD-10-CM | POA: Insufficient documentation

## 2011-05-25 DIAGNOSIS — J449 Chronic obstructive pulmonary disease, unspecified: Secondary | ICD-10-CM | POA: Insufficient documentation

## 2011-05-25 DIAGNOSIS — G319 Degenerative disease of nervous system, unspecified: Secondary | ICD-10-CM | POA: Insufficient documentation

## 2011-05-25 DIAGNOSIS — L03211 Cellulitis of face: Secondary | ICD-10-CM | POA: Insufficient documentation

## 2011-05-25 DIAGNOSIS — J4489 Other specified chronic obstructive pulmonary disease: Secondary | ICD-10-CM | POA: Insufficient documentation

## 2011-05-25 DIAGNOSIS — F0390 Unspecified dementia without behavioral disturbance: Secondary | ICD-10-CM | POA: Insufficient documentation

## 2011-05-25 DIAGNOSIS — I10 Essential (primary) hypertension: Secondary | ICD-10-CM | POA: Insufficient documentation

## 2011-05-25 LAB — CBC
MCV: 82.4 fL (ref 78.0–100.0)
Platelets: 191 10*3/uL (ref 150–400)
RBC: 4.25 MIL/uL (ref 4.22–5.81)
WBC: 5.3 10*3/uL (ref 4.0–10.5)

## 2011-05-25 LAB — POCT I-STAT, CHEM 8
BUN: 12 mg/dL (ref 6–23)
Chloride: 107 mEq/L (ref 96–112)
Potassium: 4.5 mEq/L (ref 3.5–5.1)
Sodium: 142 mEq/L (ref 135–145)

## 2011-05-25 LAB — DIFFERENTIAL
Basophils Absolute: 0 10*3/uL (ref 0.0–0.1)
Basophils Relative: 0 % (ref 0–1)
Eosinophils Absolute: 0.2 10*3/uL (ref 0.0–0.7)
Lymphs Abs: 3 10*3/uL (ref 0.7–4.0)
Neutrophils Relative %: 29 % — ABNORMAL LOW (ref 43–77)

## 2011-05-25 MED ORDER — IOHEXOL 300 MG/ML  SOLN
80.0000 mL | Freq: Once | INTRAMUSCULAR | Status: AC | PRN
  Administered 2011-05-25: 80 mL via INTRAVENOUS

## 2011-06-03 IMAGING — CR DG CHEST 2V
1 series · 1 of 1 positions shown · non-contrast
Comparison: 02/20/2009

CLINICAL DATA: Cough/body aches

CHEST - 2 VIEW

[w chest lat]
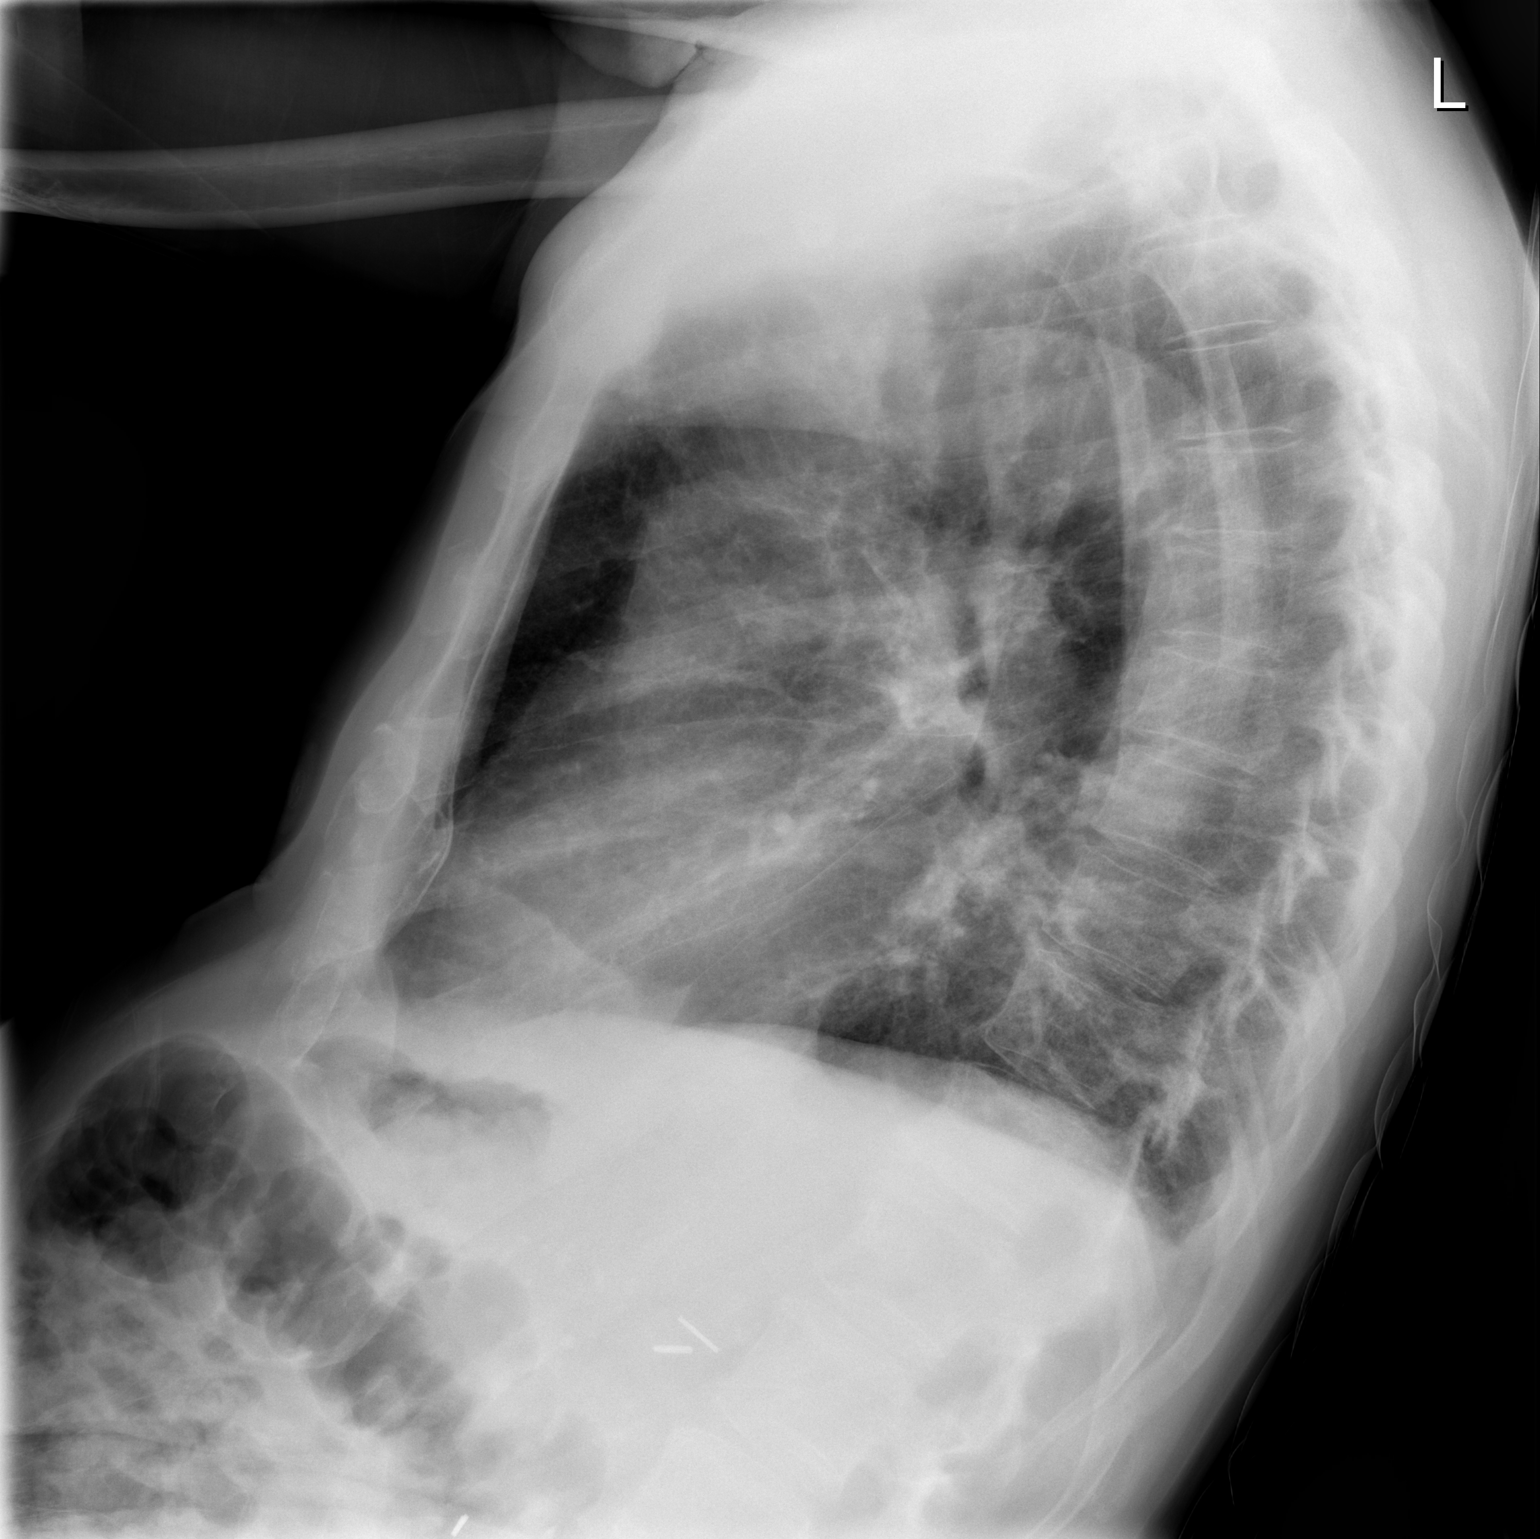

[1 of 1 positions shown; findings below may reference images not displayed]

FINDINGS: Heart size within normal limits for AP projection.  Lungs
hyperaerated but clear.  No congestive heart failure or pleural
fluid.  Osseous structures intact.
IMPRESSION: Chronic changes including an element of COPD - no active airspace
disease or heart failure.

## 2012-11-29 DIAGNOSIS — R197 Diarrhea, unspecified: Secondary | ICD-10-CM

## 2012-11-29 DIAGNOSIS — J449 Chronic obstructive pulmonary disease, unspecified: Secondary | ICD-10-CM

## 2012-11-29 DIAGNOSIS — J4489 Other specified chronic obstructive pulmonary disease: Secondary | ICD-10-CM

## 2012-11-29 DIAGNOSIS — R112 Nausea with vomiting, unspecified: Secondary | ICD-10-CM

## 2013-01-03 DIAGNOSIS — J449 Chronic obstructive pulmonary disease, unspecified: Secondary | ICD-10-CM

## 2013-01-03 DIAGNOSIS — F0281 Dementia in other diseases classified elsewhere with behavioral disturbance: Secondary | ICD-10-CM

## 2013-01-29 DIAGNOSIS — N400 Enlarged prostate without lower urinary tract symptoms: Secondary | ICD-10-CM

## 2013-01-29 DIAGNOSIS — F0391 Unspecified dementia with behavioral disturbance: Secondary | ICD-10-CM

## 2013-03-14 ENCOUNTER — Non-Acute Institutional Stay (SKILLED_NURSING_FACILITY): Payer: PRIVATE HEALTH INSURANCE | Admitting: Internal Medicine

## 2013-03-14 DIAGNOSIS — J449 Chronic obstructive pulmonary disease, unspecified: Secondary | ICD-10-CM

## 2013-03-14 DIAGNOSIS — F0281 Dementia in other diseases classified elsewhere with behavioral disturbance: Secondary | ICD-10-CM

## 2013-03-14 NOTE — Progress Notes (Signed)
Patient ID: Bobby [name], male   DOB: 11/23/1927, 79 y.o.   MRN: [id number] Facility; [facility], [facility]. Chief complaint; review of medical issues/routine [facility] visit for June. History this is a patient who has been in the facility since 2011. His predominant problem is dementia with behavioral disturbance. He also has significant COPD. He is on an aggressive regimen for the COPD. Able to propel himself around the facility and is wheelchair Medical history/problem list.   #1 dementia with behavioral disturbances. #2 COPD #3 BPH. #4 generalized osteoarthritis #5 B12 deficiency. #6 hypertension   Medication list is reviewed.  Social history; patient remains a full code.  Physical exam; weight is 160 pounds. Respirations 24 blood pressure 123/74. Respiratory clear entry bilaterally. Cardiac heart sounds are normal. No murmurs. No evidence of CHF. Abdomen no liver no spleen no tenderness or  Impressions and plan #1 COPD this seems stable. He is on an aggressive regimen. #2 dementia with behavioral disturbances. He is having his Depakote gradually withdrawn. He remains on Aricept.

## 2013-04-22 ENCOUNTER — Non-Acute Institutional Stay (SKILLED_NURSING_FACILITY): Payer: PRIVATE HEALTH INSURANCE | Admitting: Internal Medicine

## 2013-04-22 DIAGNOSIS — J449 Chronic obstructive pulmonary disease, unspecified: Secondary | ICD-10-CM

## 2013-04-22 NOTE — Progress Notes (Signed)
Patient ID: Bobby [name], male   DOB: 1927/12/04, 79 y.o.   MRN: [id number]  Facility; [facility], [facility]. Chief complaint; review of medical issues/routine [facility] visit for July . History this is a patient who has been in the facility since 2011. His predominant problem is dementia with behavioral disturbance. He also has significant COPD. He is on an aggressive regimen for the COPD. Able to propel himself around the facility and is wheelchair Medical history/problem list.   #1 dementia with behavioral disturbances. #2 COPD #3 BPH. #4 generalized osteoarthritis #5 B12 deficiency. #6 hypertension   Medication list is reviewed.  Social history; patient remains a full code.  Physical exam; weight is 160 pounds. Respirations 24 blood pressure 130/70  Respiratory clear entry bilaterally. Cardiac heart sounds are normal. No murmurs. No evidence of CHF. Abdomen no liver no spleen no tenderness or  Impressions and plan #1 COPD this seems stable. He is on an aggressive regimen. #2 dementia with behavioral disturbances. He is having his Depakote gradually withdrawn. He remains on Aricept.

## 2013-05-21 ENCOUNTER — Non-Acute Institutional Stay (SKILLED_NURSING_FACILITY): Payer: PRIVATE HEALTH INSURANCE | Admitting: Internal Medicine

## 2013-05-21 DIAGNOSIS — J449 Chronic obstructive pulmonary disease, unspecified: Secondary | ICD-10-CM

## 2013-05-21 DIAGNOSIS — J690 Pneumonitis due to inhalation of food and vomit: Secondary | ICD-10-CM

## 2013-05-21 NOTE — Progress Notes (Signed)
Patient ID: Bobby [name], male   DOB: 17-Jan-1928, 79 y.o.   MRN: [id number]   Facility; [facility], [facility]. Chief complaint; review of medical issues/routine [facility] visit for August. History this is a patient who has been in the facility since 2011. His predominant problem is dementia with behavioral disturbance. He also has significant COPD. He is on an aggressive regimen for the COPD. Able to propel himself around the facility and is wheelchair. He was treated earlier this month for an aspiration pneumonitis. Chest x-ray revealed a right lower lobe pneumonia. He was treated aggressively also for a COPD exacerbation.  Medical history/problem list. #1 dementia with behavioral disturbances. #2 COPD #3 BPH. #4 generalized osteoarthritis #5 B12 deficiency. #6 hypertension   Medication list is reviewed.  Social history; patient remains a full code.  Physical exam; weight is 160 pounds. Respirations 24 blood pressure 130/70  Respiratory clear entry bilaterally. Cardiac heart sounds are normal. No murmurs. No evidence of CHF. Abdomen no liver no spleen no tenderness or  Impressions and plan #1 COPD this seems stable. He is on an aggressive regimen.  #2 pneumonia successfully treated #3dementia with behavioral disturbances. He is having his Depakote gradually withdrawn. He remains on Aricept.

## 2013-06-17 ENCOUNTER — Non-Acute Institutional Stay (SKILLED_NURSING_FACILITY): Payer: PRIVATE HEALTH INSURANCE | Admitting: Internal Medicine

## 2013-06-17 DIAGNOSIS — J690 Pneumonitis due to inhalation of food and vomit: Secondary | ICD-10-CM

## 2013-06-17 DIAGNOSIS — J449 Chronic obstructive pulmonary disease, unspecified: Secondary | ICD-10-CM

## 2013-06-17 NOTE — Progress Notes (Signed)
Patient ID: Bobby [name], male   DOB: 1928-08-21, 79 y.o.   MRN: [id number]    Facility; [facility], [facility]. Chief complaint; review of medical issues/routine [facility] visit for September History this is a patient who has been in the facility since 2011. His predominant problem is dementia with behavioral disturbance. He also has significant COPD. He is on an aggressive regimen for the COPD. Able to propel himself around the facility and is wheelchair. There does not appear to been any recent issues this month.   Medical history/problem list. #1 dementia with behavioral disturbances. #2 COPD #3 BPH. #4 generalized osteoarthritis #5 B12 deficiency. #6 hypertension   Medication list is reviewed.  Social history; patient remains a full code.  Physical exam; weight is 165 pounds which is stable to increase over the last several. Respirations 24 blood pressure 130/85 Respiratory clear entry bilaterally. Cardiac heart sounds are normal. No murmurs. No evidence of CHF. Abdomen no liver no spleen no tenderness or  Impressions and plan #1 COPD this seems stable. He is on an aggressive regimen.  #2 pneumonia successfully treated last month chest x-ray at that time showed no acute cardiopulmonary process chronic interstitial lung changes #3dementia with behavioral disturbances. He is having his Depakote gradually withdrawn. He remains on Aricept.

## 2013-07-16 ENCOUNTER — Non-Acute Institutional Stay (SKILLED_NURSING_FACILITY): Payer: PRIVATE HEALTH INSURANCE | Admitting: Internal Medicine

## 2013-07-16 DIAGNOSIS — F0281 Dementia in other diseases classified elsewhere with behavioral disturbance: Secondary | ICD-10-CM

## 2013-07-16 DIAGNOSIS — J449 Chronic obstructive pulmonary disease, unspecified: Secondary | ICD-10-CM

## 2013-07-16 NOTE — Progress Notes (Signed)
Patient ID: Bobby [name], male   DOB: Jul 07, 1928, 79 y.o.   MRN: [id number]    Facility; [facility], [facility]. Chief complaint; review of medical issues/routine [facility] visit for October History this is a patient who has been in the facility since 2011. His predominant problem is dementia with behavioral disturbance. He also has significant COPD. He is on an aggressive regimen for the COPD. Able to propel himself around the facility and is wheelchair. There does not appear to been any recent issues this month.   Medical history/problem list. #1 dementia with behavioral disturbances. #2 COPD #3 BPH. #4 generalized osteoarthritis #5 B12 deficiency. #6 hypertension   Medication list is reviewed.  Social history; patient remains a full code.  Physical exam; weight is 165 pounds which is stable to increase over the last several. Respirations 24 blood pressure 130/85 Respiratory clear entry bilaterally. Cardiac heart sounds are normal. No murmurs. No evidence of CHF. Abdomen no liver no spleen no tenderness or  Impressions and plan #1 COPD this seems stable. He is on an aggressive regimen.  #2 pneumonia successfully treated last month chest x-ray at that time showed no acute cardiopulmonary process chronic interstitial lung changes #3dementia with behavioral disturbances. He is having his Depakote gradually withdrawn. He remains on Aricept.   Patient does remain very stable for the last several months I see no new issues

## 2013-08-12 ENCOUNTER — Non-Acute Institutional Stay (SKILLED_NURSING_FACILITY): Payer: PRIVATE HEALTH INSURANCE | Admitting: Internal Medicine

## 2013-08-12 DIAGNOSIS — J449 Chronic obstructive pulmonary disease, unspecified: Secondary | ICD-10-CM

## 2013-08-12 DIAGNOSIS — F0281 Dementia in other diseases classified elsewhere with behavioral disturbance: Secondary | ICD-10-CM

## 2013-08-12 NOTE — Progress Notes (Signed)
Patient ID: Bobby [name], male   DOB: 1928/04/12, 79 y.o.   MRN: [id number]    Facility; [facility], [facility]. Chief complaint; review of medical issues/routine [facility] visit for November. History this is a patient who has been in the facility since 2011. His predominant problem is dementia with behavioral disturbance. He also has significant COPD. He is on an aggressive regimen for the COPD. Able to propel himself around the facility and is wheelchair. There does not appear to been any recent issues this month.     Medical history/problem list. #1 dementia with behavioral disturbances. #2 COPD #3 BPH. #4 generalized osteoarthritis #5 B12 deficiency. #6 hypertension   Medication list is reviewed.  Social history; patient remains a full code.  Physical exam; temperature is 98.5 pulse 84 respirations 20 blood pressure 130/84 weight is 165 pounds Respiratory clear entry bilaterally. Cardiac heart sounds are normal. No murmurs. No evidence of CHF. Abdomen no liver no spleen no tenderness or  Impressions and plan #1 COPD this seems stable. He is on an aggressive regimen.  #2 pneumonia successfully treated last month chest x-ray at that time showed no acute cardiopulmonary process chronic interstitial lung changes #3dementia with behavioral disturbances. He is having his Depakote gradually withdrawn. He remains on Aricept.   Patient does remain very stable for the last several months I see no new issues

## 2013-09-17 ENCOUNTER — Non-Acute Institutional Stay (SKILLED_NURSING_FACILITY): Payer: PRIVATE HEALTH INSURANCE | Admitting: Internal Medicine

## 2013-09-17 DIAGNOSIS — J449 Chronic obstructive pulmonary disease, unspecified: Secondary | ICD-10-CM

## 2013-09-17 DIAGNOSIS — F0281 Dementia in other diseases classified elsewhere with behavioral disturbance: Secondary | ICD-10-CM

## 2013-09-17 DIAGNOSIS — F02818 Dementia in other diseases classified elsewhere, unspecified severity, with other behavioral disturbance: Secondary | ICD-10-CM

## 2013-09-17 NOTE — Progress Notes (Signed)
Patient ID: Bobby [name], male   DOB: 06-17-1928, 80 y.o.   MRN: [id number] P    Facility; [facility], [facility]. Chief complaint; review of medical issues/routine [facility] visit for December History this is a patient who has been in the facility since 2011. His predominant problem is dementia with behavioral disturbance. He also has significant COPD. He is on an aggressive regimen for the COPD. Able to propel himself around the facility and is wheelchair. There does not appear to been any recent issues this month.     Medical history/problem list. #1 dementia with behavioral disturbances. #2 COPD #3 BPH. #4 generalized osteoarthritis #5 B12 deficiency. #6 hypertension   Medication list is reviewed.  Social history; patient remains a full code.  Physical exam; temperature is 98.5 pulse 84 respirations 20 blood pressure 130/84 weight is 165 pounds Respiratory clear entry bilaterally. Cardiac heart sounds are normal. No murmurs. No evidence of CHF. Abdomen no liver no spleen no tenderness or  Impressions and plan #1 COPD this seems stable. He is on an aggressive regimen.  #2 pneumonia successfully treated last month chest x-ray at that time showed no acute cardiopulmonary process chronic interstitial lung changes #3dementia with behavioral disturbances. He is having his Depakote gradually withdrawn. He remains on Aricept.   Patient does remain very stable for the last several months I see no new issues

## 2013-10-31 ENCOUNTER — Non-Acute Institutional Stay (SKILLED_NURSING_FACILITY): Payer: PRIVATE HEALTH INSURANCE | Admitting: Internal Medicine

## 2013-10-31 DIAGNOSIS — J449 Chronic obstructive pulmonary disease, unspecified: Secondary | ICD-10-CM

## 2013-10-31 DIAGNOSIS — M159 Polyosteoarthritis, unspecified: Secondary | ICD-10-CM

## 2013-10-31 DIAGNOSIS — F02818 Dementia in other diseases classified elsewhere, unspecified severity, with other behavioral disturbance: Secondary | ICD-10-CM

## 2013-10-31 DIAGNOSIS — F0281 Dementia in other diseases classified elsewhere with behavioral disturbance: Secondary | ICD-10-CM

## 2013-10-31 DIAGNOSIS — F03918 Unspecified dementia, unspecified severity, with other behavioral disturbance: Secondary | ICD-10-CM | POA: Insufficient documentation

## 2013-10-31 DIAGNOSIS — F0391 Unspecified dementia with behavioral disturbance: Secondary | ICD-10-CM | POA: Insufficient documentation

## 2013-10-31 DIAGNOSIS — M810 Age-related osteoporosis without current pathological fracture: Secondary | ICD-10-CM | POA: Insufficient documentation

## 2013-10-31 NOTE — Progress Notes (Signed)
Patient ID: Bobby [name], male   DOB: 1928/01/29, 80 y.o.   MRN: [id number]      Facility; [facility], [facility]. Chief complaint; review of medical issues/routine [facility] visit for February History this is a patient who has been in the facility since 2011. His predominant problem is dementia with behavioral disturbance. He also has significant COPD. He is on an aggressive regimen for the COPD. Able to propel himself around the facility and is wheelchair. There does not appear to been any recent issues this month.     Medical history/problem list. #1 dementia with behavioral disturbances. #2 COPD #3 BPH. #4 generalized osteoarthritis #5 B12 deficiency. #6 hypertension   Medication list is reviewed.  Social history; patient remains a full code.  Physical exam; temperature is 97,P-80 r18 124/82 weight 168 Cardiac heart sounds are normal. No murmurs. No evidence of CHF. Abdomen no liver no spleen no tenderness or  Impressions and plan #1 COPD this seems stable. He is on an aggressive regimen.  #2 pneumonia successfully treated last month chest x-ray at that time showed no acute cardiopulmonary process chronic interstitial lung changes #3dementia with behavioral disturbances. He is having his Depakote gradually withdrawn. He remains on Aricept.   Patient does remain very stable for the last several months I see no new issues

## 2013-12-15 ENCOUNTER — Non-Acute Institutional Stay (SKILLED_NURSING_FACILITY): Payer: PRIVATE HEALTH INSURANCE | Admitting: Internal Medicine

## 2013-12-15 DIAGNOSIS — F02818 Dementia in other diseases classified elsewhere, unspecified severity, with other behavioral disturbance: Secondary | ICD-10-CM

## 2013-12-15 DIAGNOSIS — J449 Chronic obstructive pulmonary disease, unspecified: Secondary | ICD-10-CM

## 2013-12-15 DIAGNOSIS — F0281 Dementia in other diseases classified elsewhere with behavioral disturbance: Secondary | ICD-10-CM

## 2013-12-15 NOTE — Progress Notes (Signed)
Patient ID: Bobby [name], male   DOB: September 21, 1927, 80 y.o.   MRN: [id number]      Facility; [facility]. Chief complaint; review of medical issues/routine [employer] visit for February History this is a patient who has been in the facility since 2011. His predominant problem is dementia with behavioral disturbance. He also has significant COPD. He is on an aggressive regimen for the COPD. Able to propel himself around the facility and is wheelchair. There does not appear to been any recent issues this month.     Medical history/problem list. #1 dementia with behavioral disturbances. #2 COPD #3 BPH. #4 generalized osteoarthritis #5 B12 deficiency. #6 hypertension   Medications Tylenol 650 every 4 when necessary Duo nebs every 6 when necessary Ativan 0.253 times day when necessary Vitamin B12 thousand daily ASA 81 daily Proscar 5 mg daily Depakote 125 daily Claritin 10 mg daily Folic acid 1 mg daily Lopressor 12.5 twice a day Mucinex 600 every 12 Symbicort 160-4.52 puffs daily Cardura 4 mg every night Aricept 10 daily Vitamin D3 50,000 units monthly  Social history; patient remains a full code.  Physical exam;  Gen. he is not in any distress Vitals temperature 97.6-pulse 78-respirations 18-BP 122/60-weight 168 pounds-O2 sat 96% on room air Cardiac heart sounds are normal. No murmurs. No evidence of CHF. Abdomen no liver no spleen no tenderness or  Impressions and plan #1 COPD this seems stable. He is on an aggressive regimen.  #2 pneumonia successfully treated last month chest x-ray at that time showed no acute cardiopulmonary process chronic interstitial lung changes #3dementia with behavioral disturbances. He is having his Depakote gradually withdrawn. He remains on Aricept.   Patient does remain very stable for the last several months I see no new issues

## 2014-02-04 ENCOUNTER — Non-Acute Institutional Stay (SKILLED_NURSING_FACILITY): Payer: PRIVATE HEALTH INSURANCE | Admitting: Internal Medicine

## 2014-02-04 DIAGNOSIS — F0281 Dementia in other diseases classified elsewhere with behavioral disturbance: Secondary | ICD-10-CM

## 2014-02-04 DIAGNOSIS — J449 Chronic obstructive pulmonary disease, unspecified: Secondary | ICD-10-CM

## 2014-02-04 DIAGNOSIS — F02818 Dementia in other diseases classified elsewhere, unspecified severity, with other behavioral disturbance: Secondary | ICD-10-CM

## 2014-02-07 NOTE — Progress Notes (Addendum)
Patient ID: Bobby [name], male   DOB: 1928-07-02, 80 y.o.   MRN: [id number]                PROGRESS NOTE  DATE:  02/04/2014    F[facility]: [facility]    LEVEL OF CARE:   SNF   Routine Visit   CHIEF COMPLAINT:  Review of general medical issues/[employer] visit for May.    HISTORY OF PRESENT ILLNESS:  This is a patient who has been in the facility since 2011.  His predominant problem is dementia with behavioral disturbances.  He also has significant COPD.  He is able to propel himself around the facility in a wheelchair.  There does not appear to have been any recent issues.    PAST MEDICAL HISTORY/PROBLEM LIST:    Dementia with behavioral disturbances.    COPD.    BPH.    Generalized osteoarthritis.    B12 deficiency.    Hypertension.    CURRENT MEDICATIONS:  Medication list is reviewed.     Vitamin B12, 1000 mg orally.    ASA 81 q.d.    Proscar 5 mg daily.    Depakote 125 daily.    Claritin 10 mg daily.    Folic acid 1 mg daily.    Lopressor 12.5 b.i.d.    Mucinex 600 q.12.    Symbicort 160/4.5, 2 puffs twice daily.    Cardura 4 mg at bedtime.    Aricept 10 q.h.s.    Vitamin D3, 50,000 U every month.     PHYSICAL EXAMINATION:   VITAL SIGNS:   TEMPERATURE:  98.4.   PULSE:  72.   RESPIRATIONS:  18.   BLOOD PRESSURE:  132/80.   WEIGHT:  168 pounds.    CARDIOVASCULAR:  CARDIAC:   Heart sounds are normal.    ARTERIAL:  His pulses are normal.   Probably some degree of PAD.   CHEST/RESPIRATORY:   Very reduced air entry bilaterally, but no crackles or wheezes.  Work of breathing appears to be normal.   GASTROINTESTINAL:  ABDOMEN:   Nondistended, soft, and nontender.    ASSESSMENT/PLAN:  COPD.  He is on an aggressive regimen for this.  He is on p.r.n. DuoNebs.  Recent chest x-ray showed no infiltrate.    Dementia with behavioral disturbances.  He is on Aricept.  I think his Depakote can probably stop.  I see no major issues here.    Hypertension.  On  metoprolol, doxazosin, aspirin.  His blood pressure is currently quite stable at 132/80.

## 2014-03-20 ENCOUNTER — Non-Acute Institutional Stay (SKILLED_NURSING_FACILITY): Payer: PRIVATE HEALTH INSURANCE | Admitting: Internal Medicine

## 2014-03-20 DIAGNOSIS — F02818 Dementia in other diseases classified elsewhere, unspecified severity, with other behavioral disturbance: Secondary | ICD-10-CM

## 2014-03-20 DIAGNOSIS — F0281 Dementia in other diseases classified elsewhere with behavioral disturbance: Secondary | ICD-10-CM

## 2014-03-20 DIAGNOSIS — J449 Chronic obstructive pulmonary disease, unspecified: Secondary | ICD-10-CM

## 2014-03-20 NOTE — Progress Notes (Signed)
Patient ID: Bobby [name], male   DOB: 01/21/1928, 80 y.o.   MRN: [id number]                PROGRESS NOTE  DATE:  03/20/2014    [facility]: [facility]    LEVEL OF CARE:   SNF   Routine Visit   CHIEF COMPLAINT:  Review of general medical issues/[employer] visit for june.    HISTORY OF PRESENT ILLNESS:  This is a patient who has been in the facility since 2011.  His predominant problem is dementia with behavioral disturbances.  He also has significant COPD.  He is able to propel himself around the facility in a wheelchair.  He appears to have been treated for a periorbital cellulitis. This has been a recurrent problem is he's had this in the past. Other than this there is been no major issues. He propels himself in a wheelchair throughout the facility. Not been any major issues with behavior  PAST MEDICAL HISTORY/PROBLEM LIST:    Dementia with behavioral disturbances.    COPD.    BPH.    Generalized osteoarthritis.    B12 deficiency.    Hypertension.    CURRENT MEDICATIONS:  Medication list is reviewed.     Vitamin B12, 1000 mg orally.    ASA 81 q.d.    Proscar 5 mg daily.    Depakote 125 daily.    Claritin 10 mg daily.    Folic acid 1 mg daily.    Lopressor 12.5 b.i.d.    Mucinex 600 q.12.    Symbicort 160/4.5, 2 puffs twice daily.    Cardura 4 mg at bedtime.    Aricept 10 q.h.s.    Vitamin D3, 50,000 U every month.     PHYSICAL EXAMINATION:   VITAL SIGNS:   TEMPERATURE:  97.3   PULSE:  72.   RESPIRATIONS:  18.   BLOOD PRESSURE:  124/84 WEIGHT:  169 pounds.    CARDIOVASCULAR:  CARDIAC:   Heart sounds are normal.    ARTERIAL:  His pulses are normal.   Probably some degree of PAD.   CHEST/RESPIRATORY:   Very reduced air entry bilaterally, but no crackles or wheezes.  Work of breathing appears to be normal.   GASTROINTESTINAL:  ABDOMEN:   Nondistended, soft, and nontender.    ASSESSMENT/PLAN:  COPD.  He is on an aggressive regimen for this.  He is on  p.r.n. DuoNebs.  Recent chest x-ray showed no infiltrate. This is currently stable.  Dementia with behavioral disturbances.  He is on Aricept.  I think his Depakote can probably stop.  I see no major issues here.    Hypertension.  On metoprolol, doxazosin, aspirin.  His blood pressure is currently quite stable in the 120's to 130's

## 2014-04-15 ENCOUNTER — Non-Acute Institutional Stay (SKILLED_NURSING_FACILITY): Payer: PRIVATE HEALTH INSURANCE | Admitting: Internal Medicine

## 2014-04-15 ENCOUNTER — Other Ambulatory Visit: Payer: Self-pay | Admitting: *Deleted

## 2014-04-15 ENCOUNTER — Encounter: Payer: Self-pay | Admitting: Internal Medicine

## 2014-04-15 DIAGNOSIS — F419 Anxiety disorder, unspecified: Secondary | ICD-10-CM

## 2014-04-15 DIAGNOSIS — F02818 Dementia in other diseases classified elsewhere, unspecified severity, with other behavioral disturbance: Secondary | ICD-10-CM

## 2014-04-15 DIAGNOSIS — D519 Vitamin B12 deficiency anemia, unspecified: Secondary | ICD-10-CM | POA: Insufficient documentation

## 2014-04-15 DIAGNOSIS — R278 Other lack of coordination: Secondary | ICD-10-CM | POA: Insufficient documentation

## 2014-04-15 DIAGNOSIS — F411 Generalized anxiety disorder: Secondary | ICD-10-CM

## 2014-04-15 DIAGNOSIS — J4489 Other specified chronic obstructive pulmonary disease: Secondary | ICD-10-CM

## 2014-04-15 DIAGNOSIS — N400 Enlarged prostate without lower urinary tract symptoms: Secondary | ICD-10-CM

## 2014-04-15 DIAGNOSIS — R279 Unspecified lack of coordination: Secondary | ICD-10-CM

## 2014-04-15 DIAGNOSIS — F0281 Dementia in other diseases classified elsewhere with behavioral disturbance: Secondary | ICD-10-CM

## 2014-04-15 DIAGNOSIS — D520 Dietary folate deficiency anemia: Secondary | ICD-10-CM

## 2014-04-15 DIAGNOSIS — M159 Polyosteoarthritis, unspecified: Secondary | ICD-10-CM

## 2014-04-15 DIAGNOSIS — J449 Chronic obstructive pulmonary disease, unspecified: Secondary | ICD-10-CM

## 2014-04-15 DIAGNOSIS — I10 Essential (primary) hypertension: Secondary | ICD-10-CM

## 2014-04-15 DIAGNOSIS — F17200 Nicotine dependence, unspecified, uncomplicated: Secondary | ICD-10-CM | POA: Insufficient documentation

## 2014-04-15 DIAGNOSIS — R262 Difficulty in walking, not elsewhere classified: Secondary | ICD-10-CM

## 2014-04-15 DIAGNOSIS — D529 Folate deficiency anemia, unspecified: Secondary | ICD-10-CM

## 2014-04-15 MED ORDER — LORAZEPAM 0.5 MG PO TABS: ORAL_TABLET | ORAL | Status: DC

## 2014-04-15 NOTE — Progress Notes (Signed)
MRN: [id number] Name: Bobby [name]  Sex: male Age: 80 y.o. DOB: 03/22/1928  [facility] Facility/Room:[facility] Level Of Care: SNF Provider: Inocencio Homes D Emergency Contacts: Extended Emergency Contact Information Primary Emergency Contact: Tennis Must [place] Phone: [phone] Relation: Daughter Secondary Emergency Contact: Adams,Alberta Address: [address], [place] [address] [place] Phone: [phone] Relation: Sister  Code Status: FULL  Allergies: Review of patient's allergies indicates no known allergies.  Chief Complaint  Patient presents with  . nursing home admission    HPI: Patient is 80 y.o. male who has tranferred from another SNF.  Past Medical History  Diagnosis Date  . Anxiety   . BPH (benign prostatic hypertrophy)   . Hypertension     No past surgical history on file.    Medication List       This list is accurate as of: 04/15/14 11:59 PM.  Always use your most recent med list.               aspirin 81 MG tablet  Take 81 mg by mouth daily.     budesonide-formoterol 160-4.5 MCG/ACT inhaler  Commonly known as:  SYMBICORT  Inhale 2 puffs into the lungs 2 (two) times daily.     divalproex 125 MG capsule  Commonly known as:  DEPAKOTE SPRINKLE  Take 125 mg by mouth daily.     donepezil 10 MG tablet  Commonly known as:  ARICEPT  Take 10 mg by mouth at bedtime.     doxazosin 4 MG tablet  Commonly known as:  CARDURA  Take 4 mg by mouth daily.     finasteride 5 MG tablet  Commonly known as:  PROSCAR  Take 5 mg by mouth daily.     folic acid 1 MG tablet  Commonly known as:  FOLVITE  Take 1 mg by mouth daily.     guaiFENesin 600 MG 12 hr tablet  Commonly known as:  MUCINEX  Take 600 mg by mouth 2 (two) times daily.     ipratropium-albuterol 0.5-2.5 (3) MG/3ML Soln  Commonly known as:  DUONEB  Take 3 mLs by nebulization every 6 (six) hours  as needed.     loratadine 10 MG tablet  Commonly known as:  CLARITIN  Take 10 mg by mouth daily.     LORazepam 0.5 MG tablet  Commonly known as:  ATIVAN  Take 1/2 tablet by mouth three times daily as needed for anxiety     Menthol 9.1 MG Lozg  Use as directed 1 lozenge in the mouth or throat every 4 (four) hours as needed.     metoprolol tartrate 25 MG tablet  Commonly known as:  LOPRESSOR  Take 12.5 mg by mouth 2 (two) times daily.     vitamin B-12 1000 MCG tablet  Commonly known as:  CYANOCOBALAMIN  Take 1,000 mcg by mouth daily.     Vitamin D (Ergocalciferol) 50000 UNITS Caps capsule  Commonly known as:  DRISDOL  Take 50,000 Units by mouth every 7 (seven) days.        Meds ordered this encounter  Medications  . Menthol 9.1 MG LOZG    Sig: Use as directed 1 lozenge in the mouth or throat every 4 (four) hours as needed.  . metoprolol tartrate (LOPRESSOR) 25 MG tablet    Sig: Take 12.5  mg by mouth 2 (two) times daily.  Marland Kitchen guaiFENesin (MUCINEX) 600 MG 12 hr tablet    Sig: Take 600 mg by mouth 2 (two) times daily.  . budesonide-formoterol (SYMBICORT) 160-4.5 MCG/ACT inhaler    Sig: Inhale 2 puffs into the lungs 2 (two) times daily.  . vitamin B-12 (CYANOCOBALAMIN) 1000 MCG tablet    Sig: Take 1,000 mcg by mouth daily.  Marland Kitchen aspirin 81 MG tablet    Sig: Take 81 mg by mouth daily.  . divalproex (DEPAKOTE SPRINKLE) 125 MG capsule    Sig: Take 125 mg by mouth daily.  Marland Kitchen loratadine (CLARITIN) 10 MG tablet    Sig: Take 10 mg by mouth daily.  . folic acid (FOLVITE) 1 MG tablet    Sig: Take 1 mg by mouth daily.  . Vitamin D, Ergocalciferol, (DRISDOL) 50000 UNITS CAPS capsule    Sig: Take 50,000 Units by mouth every 7 (seven) days.  . finasteride (PROSCAR) 5 MG tablet    Sig: Take 5 mg by mouth daily.  Marland Kitchen doxazosin (CARDURA) 4 MG tablet    Sig: Take 4 mg by mouth daily.  Marland Kitchen donepezil (ARICEPT) 10 MG tablet    Sig: Take 10 mg by mouth at bedtime.  Marland Kitchen ipratropium-albuterol (DUONEB)  0.5-2.5 (3) MG/3ML SOLN    Sig: Take 3 mLs by nebulization every 6 (six) hours as needed.     There is no immunization history on file for this patient.  History  Substance Use Topics  . Smoking status: Current Every Day Smoker  . Smokeless tobacco: Not on file  . Alcohol Use: Not on file    Family history is noncontributory    Review of Systems  DATA OBTAINED: from patient; no c/o GENERAL: Feels well no fevers, fatigue, appetite changes SKIN: No itching, rash or wounds EYES: No eye pain, redness, discharge EARS: No earache, tinnitus, change in hearing NOSE: No congestion, drainage or bleeding  MOUTH/THROAT: No mouth or tooth pain, No sore throat RESPIRATORY: No cough, wheezing, SOB CARDIAC: No chest pain, palpitations, lower extremity edema  GI: No abdominal pain, No N/V/D or constipation, No heartburn or reflux  GU: No dysuria, frequency or urgency, or incontinence  MUSCULOSKELETAL: No unrelieved bone/joint pain NEUROLOGIC: No headache, dizziness or focal weakness PSYCHIATRIC:. No behavior issue.   Filed Vitals:   04/15/14 2130  BP: 139/88  Pulse: 84  Temp: 98.6 F (37 C)  Resp: 18    Physical Exam  GENERAL APPEARANCE: Alert, minconversant. Appropriately groomed. No acute distress.  SKIN: No diaphoresis rash, HEAD: Normocephalic, atraumatic  EYES: Conjunctiva/lids clear. Pupils round, reactive. EOMs intact.  EARS: External exam WNL  MOUTH/THROAT: Lips w/o lesions RESPIRATORY: Breathing is even, unlabored. Lung sounds are diffusely dec, no wheeze   CARDIOVASCULAR: Heart RRR no murmurs, rubs or gallops. No peripheral edema.  GASTROINTESTINAL: Abdomen is soft, non-tender, not distended w/ normal bowel sounds. GENITOURINARY: Bladder non tender, not distended  MUSCULOSKELETAL: No abnormal joints or musculature NEUROLOGIC: . Cranial nerves 2-12 grossly intact PSYCHIATRIC: dementia, no behavioral issues  Patient Active Problem List   Diagnosis Date Noted  . BPH  (benign prostatic hypertrophy) 04/17/2014  . Hypertension   . Anemia associated with nutritional deficiency 04/15/2014  . Difficulty walking 04/15/2014  . Uncoordinated movements 04/15/2014  . Tobacco use disorder 04/15/2014  . Anxiety   . Dementia in conditions classified elsewhere with behavioral disturbance 10/31/2013  . Chronic airway obstruction, not elsewhere classified 10/31/2013  . Generalized osteoarthrosis, unspecified site 10/31/2013  Assessment and Plan  Dementia in conditions classified elsewhere with behavioral disturbance Continue aricept,depakote, and ativan  Chronic airway obstruction, not elsewhere classified Stable on MDIs and prn nrbs  Generalized osteoarthrosis, unspecified site On no meds  Anemia associated with nutritional deficiency Stable -on folate and B12  Anxiety Continue ativan  BPH (benign prostatic hypertrophy) Continue carduar and proscar  Hypertension Continue metoprolol and cardura    Hennie Duos, MD

## 2014-04-15 NOTE — Telephone Encounter (Signed)
[facility] 

## 2014-04-17 ENCOUNTER — Encounter: Payer: Self-pay | Admitting: Internal Medicine

## 2014-04-17 DIAGNOSIS — N400 Enlarged prostate without lower urinary tract symptoms: Secondary | ICD-10-CM | POA: Insufficient documentation

## 2014-04-17 DIAGNOSIS — I119 Hypertensive heart disease without heart failure: Secondary | ICD-10-CM | POA: Insufficient documentation

## 2014-04-17 NOTE — Assessment & Plan Note (Signed)
Continue aricept,depakote, and ativan

## 2014-04-17 NOTE — Assessment & Plan Note (Signed)
Continue ativan 

## 2014-04-17 NOTE — Assessment & Plan Note (Signed)
Continue metoprolol and cardura

## 2014-04-17 NOTE — Assessment & Plan Note (Signed)
On no meds 

## 2014-04-17 NOTE — Assessment & Plan Note (Signed)
Stable on MDIs and prn nrbs

## 2014-04-17 NOTE — Assessment & Plan Note (Signed)
Stable -on folate and B12

## 2014-04-17 NOTE — Assessment & Plan Note (Signed)
Continue carduar and proscar

## 2014-05-06 ENCOUNTER — Other Ambulatory Visit: Payer: Self-pay | Admitting: *Deleted

## 2014-05-06 MED ORDER — LORAZEPAM 0.5 MG PO TABS: ORAL_TABLET | ORAL | Status: DC

## 2014-05-06 NOTE — Telephone Encounter (Signed)
[facility] 

## 2014-05-27 ENCOUNTER — Non-Acute Institutional Stay (SKILLED_NURSING_FACILITY): Payer: PRIVATE HEALTH INSURANCE | Admitting: Internal Medicine

## 2014-05-27 DIAGNOSIS — F03918 Unspecified dementia, unspecified severity, with other behavioral disturbance: Secondary | ICD-10-CM

## 2014-05-27 DIAGNOSIS — D529 Folate deficiency anemia, unspecified: Secondary | ICD-10-CM

## 2014-05-27 DIAGNOSIS — I10 Essential (primary) hypertension: Secondary | ICD-10-CM

## 2014-05-27 DIAGNOSIS — F0391 Unspecified dementia with behavioral disturbance: Secondary | ICD-10-CM

## 2014-05-27 DIAGNOSIS — D520 Dietary folate deficiency anemia: Secondary | ICD-10-CM

## 2014-05-27 DIAGNOSIS — M810 Age-related osteoporosis without current pathological fracture: Secondary | ICD-10-CM

## 2014-05-27 DIAGNOSIS — N400 Enlarged prostate without lower urinary tract symptoms: Secondary | ICD-10-CM

## 2014-05-27 DIAGNOSIS — F419 Anxiety disorder, unspecified: Secondary | ICD-10-CM

## 2014-05-27 NOTE — Progress Notes (Signed)
MRN: [id number] Name: Bobby [name]  Sex: male Age: 80 y.o. DOB: 12-06-1927  [facility] Facility/Room: [facility] Level Of Care: SNF Provider: In[facility]encio Homes D Emergency Contacts: Extended Emergency Contact Information Primary Emergency Contact: Tennis Must [place] Phone: [phone] Relation: Daughter Secondary Emergency Contact: Adams,Alberta Address: [address], [place] [address] [place] Phone: [phone] Relation: Sister  Code Status:   Allergies: Review of patient's allergies indicates no known allergies.  Chief Complaint  Patient presents with  . Medical Management of Chronic Issues    HPI: Patient is 80 y.o.AA male who is being seen for routine issues.  Past Medical History  Diagnosis Date  . Anxiety   . BPH (benign prostatic hypertrophy)   . Hypertension   . Blood transfusion without reported diagnosis   . Cancer     colon  . Anemia     listed as B12,macrocytic    Past Surgical History  Procedure Laterality Date  . R hand surgery Right       Medication List       This list is accurate as of: 05/27/14 11:59 PM.  Always use your most recent med list.               aspirin 81 MG tablet  Take 81 mg by mouth daily.     budesonide-formoterol 160-4.5 MCG/ACT inhaler  Commonly known as:  SYMBICORT  Inhale 2 puffs into the lungs 2 (two) times daily.     divalproex 125 MG capsule  Commonly known as:  DEPAKOTE SPRINKLE  Take 125 mg by mouth daily.     donepezil 10 MG tablet  Commonly known as:  ARICEPT  Take 10 mg by mouth at bedtime.     doxazosin 4 MG tablet  Commonly known as:  CARDURA  Take 4 mg by mouth daily.     finasteride 5 MG tablet  Commonly known as:  PROSCAR  Take 5 mg by mouth daily.     folic acid 1 MG tablet  Commonly known as:  FOLVITE  Take 1 mg by mouth daily.     guaiFENesin 600 MG 12 hr tablet  Commonly known as:  MUCINEX   Take 600 mg by mouth 2 (two) times daily.     ipratropium-albuterol 0.5-2.5 (3) MG/3ML Soln  Commonly known as:  DUONEB  Take 3 mLs by nebulization every 6 (six) hours as needed.     loratadine 10 MG tablet  Commonly known as:  CLARITIN  Take 10 mg by mouth daily.     LORazepam 0.5 MG tablet  Commonly known as:  ATIVAN  Take one tablet by mouth twice daily for anxiety; Take one tablet by mouth daily as needed for anxiety     Menthol 9.1 MG Lozg  Use as directed 1 lozenge in the mouth or throat every 4 (four) hours as needed.     metoprolol tartrate 25 MG tablet  Commonly known as:  LOPRESSOR  Take 12.5 mg by mouth 2 (two) times daily.     vitamin B-12 1000 MCG tablet  Commonly known as:  CYANOCOBALAMIN  Take 1,000 mcg by mouth daily.     Vitamin D (Ergocalciferol) 50000 UNITS Caps capsule  Commonly known as:  DRISDOL  Take 50,000 Units by mouth every 7 (seven) days.  No orders of the defined types were placed in this encounter.     There is no immunization history on file for this patient.  History  Substance Use Topics  . Smoking status: Current Every Day Smoker  . Smokeless tobacco: Not on file  . Alcohol Use: Not on file    Review of Systems    UTO; nurses vice no concerns    There were no vitals filed for this visit.  Physical Exam  GENERAL APPEARANCE: Alert,moin conversant, No acute distress  SKIN: No diaphoresis rash HEENT: Unremarkable RESPIRATORY: Breathing is even, unlabored. Lung sounds are clear   CARDIOVASCULAR: Heart RRR no murmurs, rubs or gallops. No peripheral edema  GASTROINTESTINAL: Abdomen is soft, non-tender, not distended w/ normal bowel sounds.  GENITOURINARY: Bladder non tender, not distended  MUSCULOSKELETAL: No abnormal joints or musculature NEUROLOGIC: Cranial nerves 2-12 grossly intact. Moves all extremities PSYCHIATRIC: dementia, irritable, no behavioral issues today  Patient Active Problem List   Diagnosis Date  Noted  . Anemia, folate deficiency 06/02/2014  . BPH (benign prostatic hypertrophy) 04/17/2014  . Hypertension   . Anemia, B12 deficiency 04/15/2014  . Difficulty walking 04/15/2014  . Uncoordinated movements 04/15/2014  . Tobacco use disorder 04/15/2014  . Anxiety   . Dementia with behavioral disturbance 10/31/2013  . Chronic airway obstruction, not elsewhere classified 10/31/2013  . Osteoporosis, senile 10/31/2013    CBC    Component Value Date/Time   WBC 5.3 05/25/2011 1955   RBC 4.25 05/25/2011 1955   RBC 4.11* 09/30/2009 0019   HGB 12.6* 05/25/2011 2039   HCT 37.0* 05/25/2011 2039   PLT 191 05/25/2011 1955   MCV 82.4 05/25/2011 1955   LYMPHSABS 3.0 05/25/2011 1955   MONOABS 0.5 05/25/2011 1955   EOSABS 0.2 05/25/2011 1955   BASOSABS 0.0 05/25/2011 1955    CMP     Component Value Date/Time   NA 142 05/25/2011 2039   K 4.5 05/25/2011 2039   CL 107 05/25/2011 2039   CO2 22 01/26/2010 0350   GLUCOSE 98 05/25/2011 2039   BUN 12 05/25/2011 2039   CREATININE 1.10 05/25/2011 2039   CALCIUM 8.6 01/26/2010 0350   PROT 7.1 01/22/2009 1250   ALBUMIN 3.4* 01/22/2009 1250   AST 17 01/22/2009 1250   ALT 8 01/22/2009 1250   ALKPHOS 71 01/22/2009 1250   BILITOT 0.7 01/22/2009 1250   GFRNONAA >60 01/26/2010 0350   GFRAA  Value: >60        The eGFR has been calculated using the MDRD equation. This calculation has not been validated in all clinical situations. eGFR's persistently <60 mL/min signify possible Chronic Kidney Disease. 01/26/2010 0350    Assessment and Plan  Hypertension Pt treated with lopressor and cardura-to continue  Dementia with behavioral disturbance Ongoing dementia with occ outbursts. Psych consult 9/14 rec schedule ativan instead of prn and inc depakoteto 250 mg BID; continue aricept  Anxiety Pt's effect labile and irritable and psych has rec ativan 0.5 mg BID instead of prn  BPH (benign prostatic hypertrophy) On cardura and proscar with no signs of obstruction;plan continue current  meds  Osteoporosis, senile Chronic and stable with no reent problems with untx pain;continue tylenol; continue Vit d  Anemia, B12 deficiency Pt refuses labs;will continue B12 and folaate  Anemia, folate deficiency Continue folate repletion;pt refuses labwork    Hennie Duos, MD

## 2014-06-02 ENCOUNTER — Encounter: Payer: Self-pay | Admitting: Internal Medicine

## 2014-06-02 DIAGNOSIS — D529 Folate deficiency anemia, unspecified: Secondary | ICD-10-CM | POA: Insufficient documentation

## 2014-06-02 DIAGNOSIS — D649 Anemia, unspecified: Secondary | ICD-10-CM | POA: Insufficient documentation

## 2014-06-02 NOTE — Assessment & Plan Note (Signed)
Ongoing dementia with occ outbursts. Psych consult 9/14 rec schedule ativan instead of prn and inc depakoteto 250 mg BID; continue aricept

## 2014-06-02 NOTE — Assessment & Plan Note (Signed)
Pt treated with lopressor and cardura-to continue

## 2014-06-02 NOTE — Assessment & Plan Note (Signed)
Continue folate repletion;pt refuses labwork

## 2014-06-02 NOTE — Assessment & Plan Note (Signed)
Pt refuses labs;will continue B12 and folaate

## 2014-06-02 NOTE — Assessment & Plan Note (Signed)
Pt's effect labile and irritable and psych has rec ativan 0.5 mg BID instead of prn

## 2014-06-02 NOTE — Assessment & Plan Note (Signed)
On cardura and proscar with no signs of obstruction;plan continue current meds

## 2014-06-02 NOTE — Assessment & Plan Note (Signed)
Chronic and stable with no reent problems with untx pain;continue tylenol; continue Vit d

## 2014-06-03 ENCOUNTER — Encounter: Payer: Self-pay | Admitting: Internal Medicine

## 2014-06-03 NOTE — Progress Notes (Signed)
This encounter was created in error - please disregard.

## 2014-09-25 ENCOUNTER — Non-Acute Institutional Stay (SKILLED_NURSING_FACILITY): Payer: Medicare Other | Admitting: Internal Medicine

## 2014-09-25 DIAGNOSIS — M150 Primary generalized (osteo)arthritis: Secondary | ICD-10-CM

## 2014-09-25 DIAGNOSIS — D519 Vitamin B12 deficiency anemia, unspecified: Secondary | ICD-10-CM

## 2014-09-25 DIAGNOSIS — J439 Emphysema, unspecified: Secondary | ICD-10-CM

## 2014-09-25 DIAGNOSIS — F419 Anxiety disorder, unspecified: Secondary | ICD-10-CM

## 2014-09-25 DIAGNOSIS — N400 Enlarged prostate without lower urinary tract symptoms: Secondary | ICD-10-CM

## 2014-09-25 DIAGNOSIS — F03918 Unspecified dementia, unspecified severity, with other behavioral disturbance: Secondary | ICD-10-CM

## 2014-09-25 DIAGNOSIS — M159 Polyosteoarthritis, unspecified: Secondary | ICD-10-CM

## 2014-09-25 DIAGNOSIS — F0391 Unspecified dementia with behavioral disturbance: Secondary | ICD-10-CM

## 2014-09-25 NOTE — Progress Notes (Signed)
MRN: [id number] Name: Bobby [name]  Sex: male Age: 81 y.o. DOB: Mar 21, 1928  [place] #: [facility] Facility/Room:126B Level Of Care: SNF Provider: Inocencio Homes D Emergency Contacts: Extended Emergency Contact Information Primary Emergency Contact: Tennis Must [place] of [place] Phone: [phone] Relation: Daughter Secondary Emergency Contact: Adams,Alberta Address: [address], [place] [address] [place] Phone: [phone] Relation: Sister  Code Status: FULL  Allergies: Review of patient's allergies indicates no known allergies.  Chief Complaint  Patient presents with  . Medical Management of Chronic Issues    HPI: Patient is 81 y.o. male who is being seen for routine issues.  Past Medical History  Diagnosis Date  . Anxiety   . BPH (benign prostatic hypertrophy)   . Hypertension   . Blood transfusion without reported diagnosis   . Cancer     colon  . Anemia     listed as B12,macrocytic    Past Surgical History  Procedure Laterality Date  . R hand surgery Right       Medication List       This list is accurate as of: 09/25/14 11:59 PM.  Always use your most recent med list.               aspirin 81 MG tablet  Take 81 mg by mouth daily.     budesonide-formoterol 160-4.5 MCG/ACT inhaler  Commonly known as:  SYMBICORT  Inhale 2 puffs into the lungs 2 (two) times daily.     divalproex 250 MG DR tablet  Commonly known as:  DEPAKOTE  Take 375 mg by mouth 2 (two) times daily.     donepezil 10 MG tablet  Commonly known as:  ARICEPT  Take 10 mg by mouth at bedtime.     doxazosin 4 MG tablet  Commonly known as:  CARDURA  Take 4 mg by mouth daily.     finasteride 5 MG tablet  Commonly known as:  PROSCAR  Take 5 mg by mouth daily.     folic acid 1 MG tablet  Commonly known as:  FOLVITE  Take 1 mg by mouth daily.     guaiFENesin 600 MG 12 hr tablet  Commonly known as:   MUCINEX  Take 600 mg by mouth 2 (two) times daily.     ipratropium-albuterol 0.5-2.5 (3) MG/3ML Soln  Commonly known as:  DUONEB  Take 3 mLs by nebulization every 6 (six) hours as needed.     loratadine 10 MG tablet  Commonly known as:  CLARITIN  Take 10 mg by mouth daily.     LORazepam 0.5 MG tablet  Commonly known as:  ATIVAN  Take one tablet by mouth twice daily for anxiety; Take one tablet by mouth daily as needed for anxiety     Menthol 9.1 MG Lozg  Use as directed 1 lozenge in the mouth or throat every 4 (four) hours as needed.     metoprolol tartrate 25 MG tablet  Commonly known as:  LOPRESSOR  Take 12.5 mg by mouth 2 (two) times daily.     vitamin B-12 1000 MCG tablet  Commonly known as:  CYANOCOBALAMIN  Take 1,000 mcg by mouth daily.     Vitamin D (Ergocalciferol) 50000 UNITS Caps capsule  Commonly known as:  DRISDOL  Take 50,000 Units by mouth every 7 (seven) days.  Meds ordered this encounter  Medications  . divalproex (DEPAKOTE) 250 MG DR tablet    Sig: Take 375 mg by mouth 2 (two) times daily.     There is no immunization history on file for this patient.  History  Substance Use Topics  . Smoking status: Current Every Day Smoker  . Smokeless tobacco: Not on file  . Alcohol Use: Not on file    Review of Systems  DATA OBTAINED: from patient, nurse GENERAL:  no fevers, fatigue, appetite changes SKIN: No itching, rash HEENT: No complaint RESPIRATORY: No cough, wheezing, SOB CARDIAC: No chest pain, palpitations, lower extremity edema  GI: No abdominal pain, No N/V/D or constipation, No heartburn or reflux  GU: No dysuria, frequency or urgency, or incontinence  MUSCULOSKELETAL: No unrelieved bone/joint pain NEUROLOGIC: No headache, dizziness  PSYCHIATRIC: No overt anxiety or sadness  Filed Vitals:   09/29/14 1408  BP: 145/89  Pulse: 78  Temp: 95 F (35 C)  Resp: 20    Physical Exam  GENERAL APPEARANCE: Alert,min conversant, No  acute distress; elderly BM  SKIN: No diaphoresis rash HEENT: Unremarkable RESPIRATORY: Breathing is even, unlabored. Lung sounds are clear   CARDIOVASCULAR: Heart RRR no murmurs, rubs or gallops. No peripheral edema  GASTROINTESTINAL: Abdomen is soft, non-tender, not distended w/ normal bowel sounds  MUSCULOSKELETAL: No abnormal joints or musculature NEUROLOGIC: Cranial nerves 2-12 grossly intact. Moves all extremities PSYCHIATRIC: very ornery today;resists exam  Patient Active Problem List   Diagnosis Date Noted  . Osteoarthritis, multiple sites 09/29/2014  . Anemia, folate deficiency 06/02/2014  . BPH (benign prostatic hypertrophy) 04/17/2014  . Hypertension   . Anemia, B12 deficiency 04/15/2014  . Difficulty walking 04/15/2014  . Uncoordinated movements 04/15/2014  . Tobacco use disorder 04/15/2014  . Anxiety   . Dementia with behavioral disturbance 10/31/2013  . COPD (chronic obstructive pulmonary disease) 10/31/2013  . Osteoporosis, senile 10/31/2013    CBC    Component Value Date/Time   WBC 5.3 05/25/2011 1955   RBC 4.25 05/25/2011 1955   RBC 4.11* 09/30/2009 0019   HGB 12.6* 05/25/2011 2039   HCT 37.0* 05/25/2011 2039   PLT 191 05/25/2011 1955   MCV 82.4 05/25/2011 1955   LYMPHSABS 3.0 05/25/2011 1955   MONOABS 0.5 05/25/2011 1955   EOSABS 0.2 05/25/2011 1955   BASOSABS 0.0 05/25/2011 1955    CMP     Component Value Date/Time   NA 142 05/25/2011 2039   K 4.5 05/25/2011 2039   CL 107 05/25/2011 2039   CO2 22 01/26/2010 0350   GLUCOSE 98 05/25/2011 2039   BUN 12 05/25/2011 2039   CREATININE 1.10 05/25/2011 2039   CALCIUM 8.6 01/26/2010 0350   PROT 7.1 01/22/2009 1250   ALBUMIN 3.4* 01/22/2009 1250   AST 17 01/22/2009 1250   ALT 8 01/22/2009 1250   ALKPHOS 71 01/22/2009 1250   BILITOT 0.7 01/22/2009 1250   GFRNONAA >60 01/26/2010 0350   GFRAA  01/26/2010 0350    >60        The eGFR has been calculated using the MDRD equation. This calculation has  not been validated in all clinical situations. eGFR's persistently <60 mL/min signify possible Chronic Kidney Disease.    Assessment and Plan  COPD (chronic obstructive pulmonary disease) Chronic and stable on duoneb, symbicort, mucinex   BPH (benign prostatic hypertrophy) Without urinary obstruction or recent UTI;Continue proscar and doxazosin   Anemia, B12 deficiency Pt still refusing labs;continue B12 supplement   Dementia with behavioral disturbance  Pt on aricept and depakote for behavoirs. Depakote has been increased to 375 BID   Anxiety Pt has scheduled ativan as well as prn Ativan for outbursts.   Osteoarthritis, multiple sites Controlled fortunately with prn tylenol     Hennie Duos, MD

## 2014-09-29 ENCOUNTER — Encounter: Payer: Self-pay | Admitting: Internal Medicine

## 2014-09-29 DIAGNOSIS — M159 Polyosteoarthritis, unspecified: Secondary | ICD-10-CM | POA: Insufficient documentation

## 2014-09-29 NOTE — Assessment & Plan Note (Signed)
Chronic and stable on duoneb, symbicort, mucinex

## 2014-09-29 NOTE — Assessment & Plan Note (Signed)
Controlled fortunately with prn tylenol

## 2014-09-29 NOTE — Assessment & Plan Note (Signed)
Pt still refusing labs;continue B12 supplement

## 2014-09-29 NOTE — Assessment & Plan Note (Signed)
Pt on aricept and depakote for behavoirs. Depakote has been increased to 375 BID

## 2014-09-29 NOTE — Assessment & Plan Note (Signed)
Pt has scheduled ativan as well as prn Ativan for outbursts.

## 2014-09-29 NOTE — Assessment & Plan Note (Signed)
Without urinary obstruction or recent UTI;Continue proscar and doxazosin

## 2014-11-04 ENCOUNTER — Non-Acute Institutional Stay (SKILLED_NURSING_FACILITY): Payer: Medicare Other | Admitting: Internal Medicine

## 2014-11-04 DIAGNOSIS — J439 Emphysema, unspecified: Secondary | ICD-10-CM

## 2014-11-04 DIAGNOSIS — D529 Folate deficiency anemia, unspecified: Secondary | ICD-10-CM | POA: Diagnosis not present

## 2014-11-04 DIAGNOSIS — M810 Age-related osteoporosis without current pathological fracture: Secondary | ICD-10-CM

## 2014-11-04 DIAGNOSIS — M159 Polyosteoarthritis, unspecified: Secondary | ICD-10-CM

## 2014-11-04 DIAGNOSIS — I119 Hypertensive heart disease without heart failure: Secondary | ICD-10-CM | POA: Diagnosis not present

## 2014-11-04 DIAGNOSIS — M150 Primary generalized (osteo)arthritis: Secondary | ICD-10-CM | POA: Diagnosis not present

## 2014-11-04 NOTE — Progress Notes (Signed)
MRN: [id number] Name: Bobby [name]  Sex: male Age: 81 y.o. DOB: [birth year]  [place] [facility] Facility[facility] Level Of Care: SNF Provider: Inocencio Homes D Emergency Contacts: Extended Emergency Contact Information Primary Emergency Contact: Tennis Must [place] of [place] Phone: [phone] Relation: Daughter Secondary Emergency Contact: Adams,Alberta Address: [address] [place], [place] [address] [place] Phone: [phone] Relation: Sister  Code Status: FULL  Allergies: Review of patient's allergies indicates no known allergies.  Chief Complaint  Patient presents with  . Medical Management of Chronic Issues    HPI: Patient is 81 y.o. male who is being seen for routine issues.  Past Medical History  Diagnosis Date  . Anxiety   . BPH (benign prostatic hypertrophy)   . Hypertension   . Blood transfusion without reported diagnosis   . Cancer     colon  . Anemia     listed as B12,macrocytic    Past Surgical History  Procedure Laterality Date  . R hand surgery Right       Medication List       This list is accurate as of: 11/04/14 11:59 PM.  Always use your most recent med list.               aspirin 81 MG tablet  Take 81 mg by mouth daily.     budesonide-formoterol 160-4.5 MCG/ACT inhaler  Commonly known as:  SYMBICORT  Inhale 2 puffs into the lungs 2 (two) times daily.     divalproex 250 MG DR tablet  Commonly known as:  DEPAKOTE  Take 375 mg by mouth 2 (two) times daily.     donepezil 10 MG tablet  Commonly known as:  ARICEPT  Take 10 mg by mouth at bedtime.     doxazosin 4 MG tablet  Commonly known as:  CARDURA  Take 4 mg by mouth daily.     finasteride 5 MG tablet  Commonly known as:  PROSCAR  Take 5 mg by mouth daily.     folic acid 1 MG tablet  Commonly known as:  FOLVITE  Take 1 mg by mouth daily.     guaiFENesin 600 MG 12 hr tablet  Commonly known as:   MUCINEX  Take 600 mg by mouth 2 (two) times daily.     ipratropium-albuterol 0.5-2.5 (3) MG/3ML Soln  Commonly known as:  DUONEB  Take 3 mLs by nebulization every 6 (six) hours as needed.     loratadine 10 MG tablet  Commonly known as:  CLARITIN  Take 10 mg by mouth daily.     LORazepam 0.5 MG tablet  Commonly known as:  ATIVAN  Take one tablet by mouth twice daily for anxiety; Take one tablet by mouth daily as needed for anxiety     Menthol 9.1 MG Lozg  Use as directed 1 lozenge in the mouth or throat every 4 (four) hours as needed.     metoprolol tartrate 25 MG tablet  Commonly known as:  LOPRESSOR  Take 12.5 mg by mouth 2 (two) times daily.     vitamin B-12 1000 MCG tablet  Commonly known as:  CYANOCOBALAMIN  Take 1,000 mcg by mouth daily.     Vitamin D (Ergocalciferol) 50000 UNITS Caps capsule  Commonly known as:  DRISDOL  Take 50,000 Units by mouth every 7 (seven) days.  No orders of the defined types were placed in this encounter.     There is no immunization history on file for this patient.  History  Substance Use Topics  . Smoking status: Current Every Day Smoker  . Smokeless tobacco: Not on file  . Alcohol Use: Not on file    Review of Systems    Pt not,very cooperative;nursing voices no concerns    Filed Vitals:   11/04/14 2200  BP: 152/82  Pulse: 84  Temp: 98.8 F (37.1 C)  Resp: 20    Physical Exam  GENERAL APPEARANCE: Alert, nonconversant, No acute distress  SKIN: No diaphoresis rash HEENT: Unremarkable RESPIRATORY: Breathing is even, unlabored. Lung sounds are clear   CARDIOVASCULAR: Heart RRR no murmurs, rubs or gallops. No peripheral edema  GASTROINTESTINAL: Abdomen is soft, non-tender, not distended w/ normal BS MUSCULOSKELETAL: No abnormal joints or musculature NEUROLOGIC: Cranial nerves 2-12 grossly intact. Moves all extremities PSYCHIATRIC: dementia, no behavioral issues today, just no cooperative  Patient Active  Problem List   Diagnosis Date Noted  . Osteoarthritis, multiple sites 09/29/2014  . Anemia, folate deficiency 06/02/2014  . BPH (benign prostatic hypertrophy) 04/17/2014  . Benign hypertensive heart disease without heart failure   . Anemia, B12 deficiency 04/15/2014  . Difficulty walking 04/15/2014  . Uncoordinated movements 04/15/2014  . Tobacco use disorder 04/15/2014  . Anxiety   . Dementia with behavioral disturbance 10/31/2013  . COPD (chronic obstructive pulmonary disease) 10/31/2013  . Osteoporosis, senile 10/31/2013    CBC    Component Value Date/Time   WBC 5.3 05/25/2011 1955   RBC 4.25 05/25/2011 1955   RBC 4.11* 09/30/2009 0019   HGB 12.6* 05/25/2011 2039   HCT 37.0* 05/25/2011 2039   PLT 191 05/25/2011 1955   MCV 82.4 05/25/2011 1955   LYMPHSABS 3.0 05/25/2011 1955   MONOABS 0.5 05/25/2011 1955   EOSABS 0.2 05/25/2011 1955   BASOSABS 0.0 05/25/2011 1955    CMP     Component Value Date/Time   NA 142 05/25/2011 2039   K 4.5 05/25/2011 2039   CL 107 05/25/2011 2039   CO2 22 01/26/2010 0350   GLUCOSE 98 05/25/2011 2039   BUN 12 05/25/2011 2039   CREATININE 1.10 05/25/2011 2039   CALCIUM 8.6 01/26/2010 0350   PROT 7.1 01/22/2009 1250   ALBUMIN 3.4* 01/22/2009 1250   AST 17 01/22/2009 1250   ALT 8 01/22/2009 1250   ALKPHOS 71 01/22/2009 1250   BILITOT 0.7 01/22/2009 1250   GFRNONAA >60 01/26/2010 0350   GFRAA  01/26/2010 0350    >60        The eGFR has been calculated using the MDRD equation. This calculation has not been validated in all clinical situations. eGFR's persistently <60 mL/min signify possible Chronic Kidney Disease.    Assessment and Plan  Benign hypertensive heart disease without heart failure Chronic and stable and falls under JNC 8 guidelines goal of < 150/90; Plan-continue metoprolol and cardura   Anemia, folate deficiency Continue folic acis daily;Pt refuses labwork   Osteoporosis, senile Pt refuses labwork so don't  have levels;pt never goes outdoors;Plan- can change to 1000u daily   COPD (chronic obstructive pulmonary disease) No signs of sx of acute exacerbation or URI;Plan - continue sybicort scheduled and albuterol and mucinex prn   Osteoarthritis, multiple sites Chronic and stable and no reports of pain, tylenol available but not used recently     Hennie Duos, MD

## 2014-11-06 ENCOUNTER — Encounter: Payer: Self-pay | Admitting: Internal Medicine

## 2014-11-06 NOTE — Assessment & Plan Note (Signed)
No signs of sx of acute exacerbation or URI;Plan - continue sybicort scheduled and albuterol and mucinex prn

## 2014-11-06 NOTE — Assessment & Plan Note (Signed)
Pt refuses labwork so don't have levels;pt never goes outdoors;Plan- can change to 1000u daily

## 2014-11-06 NOTE — Assessment & Plan Note (Signed)
Continue folic acis daily;Pt refuses labwork

## 2014-11-06 NOTE — Assessment & Plan Note (Signed)
Chronic and stable and no reports of pain, tylenol available but not used recently

## 2014-11-06 NOTE — Assessment & Plan Note (Signed)
Chronic and stable and falls under JNC 8 guidelines goal of < 150/90; Plan-continue metoprolol and cardura

## 2015-01-02 ENCOUNTER — Other Ambulatory Visit: Payer: Self-pay | Admitting: *Deleted

## 2015-01-02 MED ORDER — LORAZEPAM 0.5 MG PO TABS: ORAL_TABLET | ORAL | Status: DC

## 2015-01-02 NOTE — Telephone Encounter (Signed)
[facility] 

## 2015-03-16 ENCOUNTER — Non-Acute Institutional Stay (SKILLED_NURSING_FACILITY): Payer: Medicare Other | Admitting: Internal Medicine

## 2015-03-16 ENCOUNTER — Encounter: Payer: Self-pay | Admitting: Internal Medicine

## 2015-03-16 DIAGNOSIS — F0391 Unspecified dementia with behavioral disturbance: Secondary | ICD-10-CM

## 2015-03-16 DIAGNOSIS — F03918 Unspecified dementia, unspecified severity, with other behavioral disturbance: Secondary | ICD-10-CM

## 2015-03-16 DIAGNOSIS — J309 Allergic rhinitis, unspecified: Secondary | ICD-10-CM | POA: Insufficient documentation

## 2015-03-16 DIAGNOSIS — J439 Emphysema, unspecified: Secondary | ICD-10-CM | POA: Diagnosis not present

## 2015-03-16 NOTE — Assessment & Plan Note (Signed)
No recent exacerbations or sx; Plan - cont symbicort, muccinex,  and duoneb prn

## 2015-03-16 NOTE — Assessment & Plan Note (Signed)
Nursing reports runny nose despite claritin;Plan - d/c claritin and start zyrtec 10 mg and monitor for improvement

## 2015-03-16 NOTE — Assessment & Plan Note (Signed)
No noted major decline;pt continues to be very ornery;Plan - cont Aricept 10 mg, ativan scheduled and prn, depakote 375 BID

## 2015-03-16 NOTE — Progress Notes (Signed)
MRN: [id number] Name: Bobby [name]  Sex: male Age: 81 y.o. DOB: [birth year]  [place] #: [facility] Facility/Room: 126 Level Of Care: SNF Provider: Inocencio Homes D Emergency Contacts: Extended Emergency Contact Information Primary Emergency Contact: Tennis Must [place] of [place] Phone: [phone] Relation: Daughter Secondary Emergency Contact: Adams,Alberta Address: [address], [place] [address] [place] Phone: [phone] Relation: Sister  Code Status: FULL  Allergies: Review of patient's allergies indicates no known allergies.  Chief Complaint  Patient presents with  . Medical Management of Chronic Issues    HPI: Patient is 81 y.o. male who is being seen for AR, COPD and dementia.  Past Medical History  Diagnosis Date  . Anxiety   . BPH (benign prostatic hypertrophy)   . Hypertension   . Blood transfusion without reported diagnosis   . Cancer     colon  . Anemia     listed as B12,macrocytic    Past Surgical History  Procedure Laterality Date  . R hand surgery Right       Medication List       This list is accurate as of: 03/16/15  3:22 PM.  Always use your most recent med list.               aspirin 81 MG tablet  Take 81 mg by mouth daily.     budesonide-formoterol 160-4.5 MCG/ACT inhaler  Commonly known as:  SYMBICORT  Inhale 2 puffs into the lungs 2 (two) times daily.     divalproex 250 MG DR tablet  Commonly known as:  DEPAKOTE  Take 375 mg by mouth 2 (two) times daily.     donepezil 10 MG tablet  Commonly known as:  ARICEPT  Take 10 mg by mouth at bedtime.     doxazosin 4 MG tablet  Commonly known as:  CARDURA  Take 4 mg by mouth daily.     finasteride 5 MG tablet  Commonly known as:  PROSCAR  Take 5 mg by mouth daily.     folic acid 1 MG tablet  Commonly known as:  FOLVITE  Take 1 mg by mouth daily.     guaiFENesin 600 MG 12 hr tablet  Commonly known  as:  MUCINEX  Take 600 mg by mouth 2 (two) times daily.     ipratropium-albuterol 0.5-2.5 (3) MG/3ML Soln  Commonly known as:  DUONEB  Take 3 mLs by nebulization every 6 (six) hours as needed.     loratadine 10 MG tablet  Commonly known as:  CLARITIN  Take 10 mg by mouth daily.     LORazepam 0.5 MG tablet  Commonly known as:  ATIVAN  Take one tablet by mouth twice daily for anxiety     Menthol 9.1 MG Lozg  Use as directed 1 lozenge in the mouth or throat every 4 (four) hours as needed.     metoprolol tartrate 25 MG tablet  Commonly known as:  LOPRESSOR  Take 12.5 mg by mouth 2 (two) times daily.     vitamin B-12 1000 MCG tablet  Commonly known as:  CYANOCOBALAMIN  Take 1,000 mcg by mouth daily.     Vitamin D (Ergocalciferol) 50000 UNITS Caps capsule  Commonly known as:  DRISDOL  Take 50,000 Units by mouth every 7 (seven) days.        No orders of  the defined types were placed in this encounter.     There is no immunization history on file for this patient.  History  Substance Use Topics  . Smoking status: Current Every Day Smoker  . Smokeless tobacco: Not on file  . Alcohol Use: Not on file    Review of Systems  DATA OBTAINED: from  nurse, medical record GENERAL:  no fevers, fatigue, appetite changes SKIN: No itching, rash HEENT: rhinorrhea RESPIRATORY: No cough, wheezing, SOB CARDIAC: No chest pain, palpitations, lower extremity edema  GI: No abdominal pain, No N/V/D or constipation, No heartburn or reflux  GU: No dysuria, frequency or urgency, or incontinence  MUSCULOSKELETAL: No unrelieved bone/joint pain NEUROLOGIC: No headache, dizziness  PSYCHIATRIC: No overt anxiety or sadness  Filed Vitals:   03/16/15 1511  BP: 103/57  Pulse: 76  Temp: 97.7 F (36.5 C)  Resp: 20    Physical Exam  GENERAL APPEARANCE: Alert, conversant, BM,No acute distress , uncoop with exam sitting in WC SKIN: No diaphoresis rash HEENT: EOMI, PERRL RESPIRATORY:  Breathing is even, unlabored; chest rise symmetrical CARDIOVASCULAR: No peripheral edema , no cyanosis GASTROINTESTINAL: Abdomen is not distended, no apparent pain GENITOURINARY: Bladder  not distended  MUSCULOSKELETAL: No abnormal joints or musculature NEUROLOGIC: Cranial nerves 2-12 grossly intact. Moves all extremities PSYCHIATRIC: dementia and ornery, at baseline  Patient Active Problem List   Diagnosis Date Noted  . AR (allergic rhinitis) 03/16/2015  . Osteoarthritis, multiple sites 09/29/2014  . Anemia, folate deficiency 06/02/2014  . BPH (benign prostatic hypertrophy) 04/17/2014  . Benign hypertensive heart disease without heart failure   . Anemia, B12 deficiency 04/15/2014  . Difficulty walking 04/15/2014  . Uncoordinated movements 04/15/2014  . Tobacco use disorder 04/15/2014  . Anxiety   . Dementia with behavioral disturbance 10/31/2013  . COPD (chronic obstructive pulmonary disease) 10/31/2013  . Osteoporosis, senile 10/31/2013      Assessment and Plan  Dementia with behavioral disturbance No noted major decline;pt continues to be very ornery;Plan - cont Aricept 10 mg, ativan scheduled and prn, depakote 375 BID  COPD (chronic obstructive pulmonary disease) No recent exacerbations or sx; Plan - cont symbicort, muccinex,  and duoneb prn  AR (allergic rhinitis) Nursing reports runny nose despite claritin;Plan - d/c claritin and start zyrtec 10 mg and monitor for improvement    Hennie Duos, MD

## 2015-07-14 ENCOUNTER — Encounter: Payer: Self-pay | Admitting: Internal Medicine

## 2015-07-14 ENCOUNTER — Non-Acute Institutional Stay (SKILLED_NURSING_FACILITY): Payer: Medicare Other | Admitting: Internal Medicine

## 2015-07-14 DIAGNOSIS — J439 Emphysema, unspecified: Secondary | ICD-10-CM

## 2015-07-14 DIAGNOSIS — F03918 Unspecified dementia, unspecified severity, with other behavioral disturbance: Secondary | ICD-10-CM

## 2015-07-14 DIAGNOSIS — M159 Polyosteoarthritis, unspecified: Secondary | ICD-10-CM

## 2015-07-14 DIAGNOSIS — N400 Enlarged prostate without lower urinary tract symptoms: Secondary | ICD-10-CM

## 2015-07-14 DIAGNOSIS — I10 Essential (primary) hypertension: Secondary | ICD-10-CM

## 2015-07-14 DIAGNOSIS — M150 Primary generalized (osteo)arthritis: Secondary | ICD-10-CM | POA: Diagnosis not present

## 2015-07-14 DIAGNOSIS — F419 Anxiety disorder, unspecified: Secondary | ICD-10-CM

## 2015-07-14 DIAGNOSIS — F0391 Unspecified dementia with behavioral disturbance: Secondary | ICD-10-CM

## 2015-07-14 NOTE — Progress Notes (Signed)
Patient ID: Bobby [name], male   DOB: 08/31/1927, 81 y.o.   MRN: [id number]    DATE: 07/14/15  Location:  [facility]    Place of Service: SNF (31)   Extended Emergency Contact Information Primary Emergency Contact: [place] Phone: [phone] Relation: Daughter Secondary Emergency Contact: Adams,Alberta Address: [address], [place] [address] [place] Phone: [phone] Relation: Sister  Advanced Directive information  DNR/DNI  Chief Complaint  Patient presents with  . Medical Management of Chronic Issues    HPI:  81 yo male long term resident seen today for f/u. He has no c/o today. Appetite is excellent. Sleeping well. No nursing issues. No recent falls. HE REFUSES ALL LAB DRAWS AND URINE COLLECTION. He is a poor historian due to dementia. Hx obtained from chart  Dementia - takes aricept for cognition. Mood stable on depakote and ativan  HTN - BP stable on metoprolol and cardura. Takes ASA daily  COPD - stable on mucinex, symbicort and duonebs. Takes claritin for seasonal allergy  BPH - sx's stable on proscar and cardura  Hx colon CA - stable  Anemia hx - takes B12 and folate supplements  Joint pain due to OA - stable pain  Past Medical History  Diagnosis Date  . Anxiety   . BPH (benign prostatic hypertrophy)   . Hypertension   . Blood transfusion without reported diagnosis   . Cancer ([place])     colon  . Anemia     listed as B12,macrocytic    Past Surgical History  Procedure Laterality Date  . R hand surgery Right     No care team member to display  Social History   Social History  . Marital Status: Widowed    Spouse Name: N/A  . Number of Children: N/A  . Years of Education: N/A   Occupational History  . Not on file.   Social History Main Topics  . Smoking status: Current Every Day Smoker  . Smokeless tobacco: Not on file  .  Alcohol Use: Not on file  . Drug Use: Not on file  . Sexual Activity: Not on file   Other Topics Concern  . Not on file   Social History Narrative     reports that he has been smoking.  He does not have any smokeless tobacco history on file. His alcohol and drug histories are not on file.   There is no immunization history on file for this patient.  No Known Allergies  Medications: Patient's Medications  New Prescriptions   No medications on file  Previous Medications   ASPIRIN 81 MG TABLET    Take 81 mg by mouth daily.   BUDESONIDE-FORMOTEROL (SYMBICORT) 160-4.5 MCG/ACT INHALER    Inhale 2 puffs into the lungs 2 (two) times daily.   DIVALPROEX (DEPAKOTE) 250 MG DR TABLET    Take 375 mg by mouth 2 (two) times daily.   DONEPEZIL (ARICEPT) 10 MG TABLET    Take 10 mg by mouth at bedtime.   DOXAZOSIN (CARDURA) 4 MG TABLET    Take 4 mg by mouth daily.   FINASTERIDE (PROSCAR) 5 MG TABLET    Take 5 mg by mouth daily.   FOLIC ACID (FOLVITE) 1 MG TABLET    Take 1 mg by mouth daily.   GUAIFENESIN (MUCINEX) 600  MG 12 HR TABLET    Take 600 mg by mouth 2 (two) times daily.   IPRATROPIUM-ALBUTEROL (DUONEB) 0.5-2.5 (3) MG/3ML SOLN    Take 3 mLs by nebulization every 6 (six) hours as needed.   LORATADINE (CLARITIN) 10 MG TABLET    Take 10 mg by mouth daily.   LORAZEPAM (ATIVAN) 0.5 MG TABLET    Take one tablet by mouth twice daily for anxiety   MENTHOL 9.1 MG LOZG    Use as directed 1 lozenge in the mouth or throat every 4 (four) hours as needed.   METOPROLOL TARTRATE (LOPRESSOR) 25 MG TABLET    Take 12.5 mg by mouth 2 (two) times daily.   VITAMIN B-12 (CYANOCOBALAMIN) 1000 MCG TABLET    Take 1,000 mcg by mouth daily.   VITAMIN D, ERGOCALCIFEROL, (DRISDOL) 50000 UNITS CAPS CAPSULE    Take 50,000 Units by mouth every 7 (seven) days.  Modified Medications   No medications on file  Discontinued Medications   No medications on file    Review of Systems  Unable to perform ROS: Dementia     Filed Vitals:   07/14/15 1607  BP: 140/77  Pulse: 80  Temp: 97.6 F (36.4 C)  Weight: 151 lb 12.8 oz (68.856 kg)   There is no height on file to calculate BMI.  Physical Exam  Constitutional: He appears well-developed.  Sitting in w/c in NAD, eating  HENT:  Mouth/Throat: Oropharynx is clear and moist.  Eyes: Pupils are equal, round, and reactive to light. No scleral icterus.  Neck: Neck supple. Carotid bruit is not present.  Cardiovascular: Normal rate, regular rhythm and intact distal pulses.  Exam reveals no gallop and no friction rub.   Murmur (1/6 SEM) heard. no distal LE swelling. No calf TTP  Pulmonary/Chest: Effort normal and breath sounds normal. He has no wheezes. He has no rales. He exhibits no tenderness.  Abdominal: Soft. Bowel sounds are normal. He exhibits no distension, no abdominal bruit, no pulsatile midline mass and no mass. There is no tenderness. There is no rebound and no guarding.  Musculoskeletal: He exhibits edema.  Lymphadenopathy:    He has no cervical adenopathy.  Neurological: He is alert.  Skin: Skin is warm and dry. No rash noted.  Psychiatric: He has a normal mood and affect. His behavior is normal.     Labs reviewed: No visits with results within 3 Month(s) from this visit. Latest known visit with results is:  Admission on 05/25/2011, Discharged on 05/25/2011  Component Date Value Ref Range Status  . Neutrophils Relative % 05/25/2011 29* 43 - 77 % Final  . Neutro Abs 05/25/2011 1.6* 1.7 - 7.7 K/uL Final  . Lymphocytes Relative 05/25/2011 57* 12 - 46 % Final  . Lymphs Abs 05/25/2011 3.0  0.7 - 4.0 K/uL Final  . Monocytes Relative 05/25/2011 9  3 - 12 % Final  . Monocytes Absolute 05/25/2011 0.5  0.1 - 1.0 K/uL Final  . Eosinophils Relative 05/25/2011 4  0 - 5 % Final  . Eosinophils Absolute 05/25/2011 0.2  0.0 - 0.7 K/uL Final  . Basophils Relative 05/25/2011 0  0 - 1 % Final  . Basophils Absolute 05/25/2011 0.0  0.0 - 0.1 K/uL Final   . WBC 05/25/2011 5.3  4.0 - 10.5 K/uL Final  . RBC 05/25/2011 4.25  4.22 - 5.81 MIL/uL Final  . Hemoglobin 05/25/2011 11.9* 13.0 - 17.0 g/dL Final  . HCT 05/25/2011 35.0* 39.0 - 52.0 % Final  .  MCV 05/25/2011 82.4  78.0 - 100.0 fL Final  . MCH 05/25/2011 28.0  26.0 - 34.0 pg Final  . MCHC 05/25/2011 34.0  30.0 - 36.0 g/dL Final  . RDW 05/25/2011 16.1* 11.5 - 15.5 % Final  . Platelets 05/25/2011 191  150 - 400 K/uL Final  . Sodium 05/25/2011 142  135 - 145 mEq/L Final  . Potassium 05/25/2011 4.5  3.5 - 5.1 mEq/L Final  . Chloride 05/25/2011 107  96 - 112 mEq/L Final  . BUN 05/25/2011 12  6 - 23 mg/dL Final  . Creatinine, Ser 05/25/2011 1.10  0.50 - 1.35 mg/dL Final  . Glucose, Bld 05/25/2011 98  70 - 99 mg/dL Final  . Calcium, Ion 05/25/2011 1.22  1.12 - 1.32 mmol/L Final  . TCO2 05/25/2011 25  0 - 100 mmol/L Final  . Hemoglobin 05/25/2011 12.6* 13.0 - 17.0 g/dL Final  . HCT 05/25/2011 37.0* 39.0 - 52.0 % Final    No results found.   Assessment/Plan   ICD-9-CM ICD-10-CM   1. Dementia with behavioral disturbance 294.21 F03.91   2. Pulmonary emphysema, unspecified emphysema type ([place]) 492.8 J43.9   3. Essential hypertension 401.9 I10   4. Primary osteoarthritis involving multiple joints 715.09 M15.0   5. Anxiety 300.00 F41.9   6. BPH (benign prostatic hypertrophy) 600.00 N40.0     HE REFUSES ALL LAB DRAWS AND URINE COLLECTION  Pt is medically stable on current tx plan. Continue current medications as ordered. PT/OT/ST as indicated. Will follow  Paarth Cropper S. Perlie Gold  [facility] [address] [place], [place] [address] [phone] Cell (Monday-Friday 8 AM - 5 PM) [phone] After 5 PM and follow prompts

## 2015-07-24 ENCOUNTER — Other Ambulatory Visit: Payer: Self-pay | Admitting: *Deleted

## 2015-07-24 MED ORDER — LORAZEPAM 0.5 MG PO TABS: ORAL_TABLET | ORAL | Status: AC

## 2015-07-24 NOTE — Telephone Encounter (Signed)
[facility] 

## 2015-08-11 ENCOUNTER — Non-Acute Institutional Stay (SKILLED_NURSING_FACILITY): Payer: Medicare Other | Admitting: Internal Medicine

## 2015-08-11 DIAGNOSIS — R10816 Epigastric abdominal tenderness: Secondary | ICD-10-CM

## 2015-08-11 DIAGNOSIS — D529 Folate deficiency anemia, unspecified: Secondary | ICD-10-CM

## 2015-08-11 DIAGNOSIS — J309 Allergic rhinitis, unspecified: Secondary | ICD-10-CM | POA: Diagnosis not present

## 2015-08-11 DIAGNOSIS — M150 Primary generalized (osteo)arthritis: Secondary | ICD-10-CM

## 2015-08-11 DIAGNOSIS — F03918 Unspecified dementia, unspecified severity, with other behavioral disturbance: Secondary | ICD-10-CM

## 2015-08-11 DIAGNOSIS — I10 Essential (primary) hypertension: Secondary | ICD-10-CM

## 2015-08-11 DIAGNOSIS — F0391 Unspecified dementia with behavioral disturbance: Secondary | ICD-10-CM | POA: Diagnosis not present

## 2015-08-11 DIAGNOSIS — D519 Vitamin B12 deficiency anemia, unspecified: Secondary | ICD-10-CM | POA: Diagnosis not present

## 2015-08-11 DIAGNOSIS — J439 Emphysema, unspecified: Secondary | ICD-10-CM | POA: Diagnosis not present

## 2015-08-11 DIAGNOSIS — F419 Anxiety disorder, unspecified: Secondary | ICD-10-CM

## 2015-08-11 DIAGNOSIS — M159 Polyosteoarthritis, unspecified: Secondary | ICD-10-CM

## 2015-08-13 ENCOUNTER — Encounter: Payer: Self-pay | Admitting: Internal Medicine

## 2015-08-13 NOTE — Progress Notes (Signed)
Patient ID: Bobby [name], male   DOB: 02/23/1928, 81 y.o.   MRN: [id number]    DATE: 08/11/15  Location:  [facility]    Place of Service: SNF (31)   Extended Emergency Contact Information Primary Emergency Contact: [place] of [place] Phone: [phone] Relation: Daughter Secondary Emergency Contact: Adams,Alberta Address: [address], [place] [address] [place] Phone: [phone] Relation: Sister  Advanced Directive information  DNR/DNI  Chief Complaint  Patient presents with  . Medical Management of Chronic Issues    HPI:  81 yo male long term resident seen today for f/u. He has no c/o. He is a poor historian due to dementia. Hx obtained from chart. No nursing issues. No falls  Dementia - stable on aricept. Takes depakote and ativan for mood.  HTN - BP controlled on lopressor and cardura. He takes ASA daily  COPD - stable on symbicort, mucinex and duonebs. He takes claritin for seasonal allergy  BPH - stable on proscar and cardura  Hx colon CA - stable. No signs of recurrence. S/p resection  Anemia hx - stable on folate and B12. He also takes Vit D  Past Medical History  Diagnosis Date  . Anxiety   . BPH (benign prostatic hypertrophy)   . Hypertension   . Blood transfusion without reported diagnosis   . Cancer ([place])     colon  . Anemia     listed as B12,macrocytic    Past Surgical History  Procedure Laterality Date  . R hand surgery Right     No care team member to display  Social History   Social History  . Marital Status: Widowed    Spouse Name: N/A  . Number of Children: N/A  . Years of Education: N/A   Occupational History  . Not on file.   Social History Main Topics  . Smoking status: Current Every Day Smoker  . Smokeless tobacco: Not on file  . Alcohol Use: Not on file  . Drug Use: Not on file  . Sexual Activity: Not on file    Other Topics Concern  . Not on file   Social History Narrative     reports that he has been smoking.  He does not have any smokeless tobacco history on file. His alcohol and drug histories are not on file.  No family history on file. - unable to obtain due to dementia  Immunization History  Administered Date(s) Administered  . Influenza-Unspecified 06/04/2015    No Known Allergies  Medications: Patient's Medications  New Prescriptions   No medications on file  Previous Medications   ASPIRIN 81 MG TABLET    Take 81 mg by mouth daily.   BUDESONIDE-FORMOTEROL (SYMBICORT) 160-4.5 MCG/ACT INHALER    Inhale 2 puffs into the lungs 2 (two) times daily.   DIVALPROEX (DEPAKOTE) 250 MG DR TABLET    Take 375 mg by mouth 2 (two) times daily.   DONEPEZIL (ARICEPT) 10 MG TABLET    Take 10 mg by mouth at bedtime.   DOXAZOSIN (CARDURA) 4 MG TABLET    Take 4 mg by mouth daily.   FINASTERIDE (PROSCAR) 5 MG TABLET    Take 5 mg by mouth daily.   FOLIC ACID (FOLVITE) 1 MG TABLET    Take 1 mg by mouth daily.   GUAIFENESIN ([place])  600 MG 12 HR TABLET    Take 600 mg by mouth 2 (two) times daily.   IPRATROPIUM-ALBUTEROL (DUONEB) 0.5-2.5 (3) MG/3ML SOLN    Take 3 mLs by nebulization every 6 (six) hours as needed.   LORATADINE (CLARITIN) 10 MG TABLET    Take 10 mg by mouth daily.   LORAZEPAM (ATIVAN) 0.5 MG TABLET    Take 1/2 tablet by mouth twice daily for agitation   MENTHOL 9.1 MG LOZG    Use as directed 1 lozenge in the mouth or throat every 4 (four) hours as needed.   METOPROLOL TARTRATE (LOPRESSOR) 25 MG TABLET    Take 12.5 mg by mouth 2 (two) times daily.   VITAMIN B-12 (CYANOCOBALAMIN) 1000 MCG TABLET    Take 1,000 mcg by mouth daily.   VITAMIN D, ERGOCALCIFEROL, (DRISDOL) 50000 UNITS CAPS CAPSULE    Take 50,000 Units by mouth every 7 (seven) days.  Modified Medications   No medications on file  Discontinued Medications   No medications on file    Review of Systems  Unable to perform  ROS: Dementia    Filed Vitals:   08/11/15 2251  BP: 108/90  Pulse: 65  Temp: 98 F (36.7 C)   There is no height or weight on file to calculate BMI.  Physical Exam  Constitutional: He appears well-developed.  Lying in bed resting but easily awakened. Frail appearing in NAD  HENT:  Mouth/Throat: Oropharynx is clear and moist.  Eyes: Pupils are equal, round, and reactive to light. No scleral icterus.  Neck: Neck supple. Carotid bruit is not present. No thyromegaly present.  Cardiovascular: Normal rate, regular rhythm and intact distal pulses.  Exam reveals no gallop and no friction rub.   Murmur (1/6 SEM) heard. no distal LE swelling. No calf TTP  Pulmonary/Chest: Effort normal and breath sounds normal. He has no wheezes. He has no rales. He exhibits no tenderness.  Abdominal: Soft. Bowel sounds are normal. He exhibits no distension, no abdominal bruit, no pulsatile midline mass and no mass. There is tenderness (epigastric). There is no rebound and no guarding.  Musculoskeletal: He exhibits edema and tenderness.  Lymphadenopathy:    He has no cervical adenopathy.  Neurological: He is alert.  Skin: Skin is warm and dry. No rash noted.  Psychiatric: He has a normal mood and affect. His behavior is normal.     Labs reviewed: No visits with results within 3 Month(s) from this visit. Latest known visit with results is:  Admission on 05/25/2011, Discharged on 05/25/2011  Component Date Value Ref Range Status  . Neutrophils Relative % 05/25/2011 29* 43 - 77 % Final  . Neutro Abs 05/25/2011 1.6* 1.7 - 7.7 K/uL Final  . Lymphocytes Relative 05/25/2011 57* 12 - 46 % Final  . Lymphs Abs 05/25/2011 3.0  0.7 - 4.0 K/uL Final  . Monocytes Relative 05/25/2011 9  3 - 12 % Final  . Monocytes Absolute 05/25/2011 0.5  0.1 - 1.0 K/uL Final  . Eosinophils Relative 05/25/2011 4  0 - 5 % Final  . Eosinophils Absolute 05/25/2011 0.2  0.0 - 0.7 K/uL Final  . Basophils Relative 05/25/2011 0  0 - 1  % Final  . Basophils Absolute 05/25/2011 0.0  0.0 - 0.1 K/uL Final  . WBC 05/25/2011 5.3  4.0 - 10.5 K/uL Final  . RBC 05/25/2011 4.25  4.22 - 5.81 MIL/uL Final  . Hemoglobin 05/25/2011 11.9* 13.0 - 17.0 g/dL Final  . HCT 05/25/2011 35.0* 39.0 -  52.0 % Final  . MCV 05/25/2011 82.4  78.0 - 100.0 fL Final  . MCH 05/25/2011 28.0  26.0 - 34.0 pg Final  . MCHC 05/25/2011 34.0  30.0 - 36.0 g/dL Final  . RDW 05/25/2011 16.1* 11.5 - 15.5 % Final  . Platelets 05/25/2011 191  150 - 400 K/uL Final  . Sodium 05/25/2011 142  135 - 145 mEq/L Final  . Potassium 05/25/2011 4.5  3.5 - 5.1 mEq/L Final  . Chloride 05/25/2011 107  96 - 112 mEq/L Final  . BUN 05/25/2011 12  6 - 23 mg/dL Final  . Creatinine, Ser 05/25/2011 1.10  0.50 - 1.35 mg/dL Final  . Glucose, Bld 05/25/2011 98  70 - 99 mg/dL Final  . Calcium, Ion 05/25/2011 1.22  1.12 - 1.32 mmol/L Final  . TCO2 05/25/2011 25  0 - 100 mmol/L Final  . Hemoglobin 05/25/2011 12.6* 13.0 - 17.0 g/dL Final  . HCT 05/25/2011 37.0* 39.0 - 52.0 % Final    No results found.   Assessment/Plan   ICD-9-CM ICD-10-CM   1. Dementia with behavioral disturbance 294.21 F03.91   2. Pulmonary emphysema, unspecified emphysema type ([place]) 492.8 J43.9   3. Primary osteoarthritis involving multiple joints 715.09 M15.0   4. Essential hypertension 401.9 I10   5. Allergic rhinitis, unspecified allergic rhinitis type 477.9 J30.9   6. Anemia, folate deficiency 281.2 D52.9   7. Anemia, B12 deficiency 281.1 D51.9   8. Anxiety 300.00 F41.9   9.      Epigastric tenderness most likely related to hx Etoh abuse +/- surgical scar   T/c PPI tx for abdominal tenderness if persists  Pt is medically stable on current tx plan. Continue current medications as ordered. PT/OT/ST as indicated. Will follow  Siddhi Dornbush S. Perlie Gold  [facility] [address] [place], [place] [address] [phone] Cell (Monday-Friday 8 AM - 5  PM) [phone] After 5 PM and follow prompts

## 2015-08-24 DEATH — deceased
